# Patient Record
Sex: Female | Born: 1975
Health system: Southern US, Community
[De-identification: ages and names within clinical notes are randomized; demographics above are authoritative.]

## PROBLEM LIST (undated history)

## (undated) DIAGNOSIS — F32A Depression, unspecified: Secondary | ICD-10-CM

## (undated) DIAGNOSIS — I10 Essential (primary) hypertension: Secondary | ICD-10-CM

## (undated) DIAGNOSIS — F329 Major depressive disorder, single episode, unspecified: Secondary | ICD-10-CM

## (undated) DIAGNOSIS — F419 Anxiety disorder, unspecified: Secondary | ICD-10-CM

## (undated) DIAGNOSIS — M797 Fibromyalgia: Secondary | ICD-10-CM

## (undated) HISTORY — PX: ABDOMINAL HYSTERECTOMY: SHX81

## (undated) HISTORY — PX: APPENDECTOMY: SHX54

## (undated) HISTORY — PX: CHOLECYSTECTOMY: SHX55

## (undated) HISTORY — DX: Depression, unspecified: F32.A

---
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
## 2000-02-24 ENCOUNTER — Emergency Department (HOSPITAL_COMMUNITY): Admission: EM | Admit: 2000-02-24 | Discharge: 2000-02-24 | Payer: Self-pay | Admitting: Emergency Medicine

## 2003-10-23 ENCOUNTER — Ambulatory Visit (HOSPITAL_COMMUNITY): Admission: RE | Admit: 2003-10-23 | Discharge: 2003-10-23 | Payer: Self-pay | Admitting: Internal Medicine

## 2003-10-29 ENCOUNTER — Ambulatory Visit (HOSPITAL_COMMUNITY): Admission: RE | Admit: 2003-10-29 | Discharge: 2003-10-29 | Payer: Self-pay | Admitting: Internal Medicine

## 2004-10-10 ENCOUNTER — Ambulatory Visit: Payer: Self-pay | Admitting: Family Medicine

## 2004-10-17 ENCOUNTER — Ambulatory Visit: Payer: Self-pay | Admitting: Family Medicine

## 2008-08-18 ENCOUNTER — Emergency Department (HOSPITAL_COMMUNITY): Admission: EM | Admit: 2008-08-18 | Discharge: 2008-08-18 | Payer: Self-pay | Admitting: Emergency Medicine

## 2008-09-07 ENCOUNTER — Ambulatory Visit (HOSPITAL_COMMUNITY): Admission: RE | Admit: 2008-09-07 | Discharge: 2008-09-07 | Payer: Self-pay | Admitting: General Surgery

## 2009-07-08 ENCOUNTER — Emergency Department (HOSPITAL_COMMUNITY): Admission: EM | Admit: 2009-07-08 | Discharge: 2009-07-08 | Payer: Self-pay | Admitting: Emergency Medicine

## 2011-04-14 NOTE — H&P (Signed)
Gabrielle [name], Gabrielle [name]              ACCOUNT NO.:  [id number]   MEDICAL RECORD NO.:  [id number]          PATIENT TYPE:  AMB   LOCATION:  DAY                           FACILITY:  [facility]   PHYSICIAN:  Tilford Pillar, MD      DATE OF BIRTH:  [birth year]   DATE OF ADMISSION:  DATE OF DISCHARGE:  LH                              HISTORY & PHYSICAL   CHIEF COMPLAINT:  Perirectal pain.   HISTORY OF PRESENT ILLNESS:  The patient is a 35 year old female who has  had a several-month history of increasing perirectal pain.  Her most  recent episode was approximately a week and a half ago.  She denied any  bleeding.  No fever or chills, but she has had some nausea associated  with the pain.  She has attempted preparation H and sitz bath with  little result.  She was seen at emergency department and was told she  did have hemorrhoids and was recommended that she proceed for surgical  evaluation.  At that time, she was also given a prescription for Anusol,  which she states has helped somewhat.  She was also seen in another  outside hospital and was told that she did not have any hemorrhoidal  disease, which clearly upset the patient.  She was, at that point, seen  in my office at which time, she did proceed to show me a picture she had  taken of her prolapsed hemorrhoid.  She says the pain had increased  significantly.  She denies that this is continuously prolapsed and does  state that this does resolve because of geriatric bowel movements.  She  has had no significant change in bowel movements.  At that point, we did  discuss continued conservative measures.  With that the patient  attempted, but failed initial conservative management and at this point  did return to my office with continued pain and episodes of prolapsing  of the hemorrhoidal tissue.  Again, she has no bleeding.  She has  continued to use stool softeners.   PAST MEDICAL HISTORY:  Fibromyalgia.   PAST SURGICAL HISTORY:  Appendectomy  and cholecystectomy.  She has had  tubal ligation.  She has had tonsilloadenoidectomies.   MEDICATIONS:  She is on Cymbalta.  She is currently on Anusol, Percocet,  preparation H, and over-the-counter stool softener.   ALLERGIES:  No known drug allergies.   SOCIAL HISTORY:  She is a 1-pack per day smoker.  No alcohol use.  Occupation involves no heavy lifting.  She has had 3 prior pregnancies.   PERTINENT FAMILY HISTORY:  Diabetes mellitus and hypertension.   REVIEW OF SYSTEMS:  CONSTITUTIONAL:  Consist of headaches.  EYES:  Unremarkable.  EARS, NOSE, AND THROAT:  Unremarkable.  RESPIRATORY:  Unremarkable.  CARDIOVASCULAR:  Unremarkable.  GASTROINTESTINAL:  Unremarkable other than HPI.  GENITOURINARY:  Unremarkable.  MUSCULOSKELETAL:  Arthralgias of the joints and back.  SKIN:  Unremarkable.  ENDOCRINE:  She complains of lack of energy.  NEUROLOGIC:  She complains of peripheral paresthesias.   PHYSICAL EXAMINATION:  GENERAL:  The patient is somewhat anxious and  somewhat disheveled-appearing female.  She appeared to be somewhat  uncomfortable, but does not appear in any acute distress.  She is alert  and oriented x3.  HEENT:  Scalp:  No deformities, no masses.  Eyes:  Pupils equal, round,  and reactive to light.  Extraocular movements are intact.  No  conjunctival pallor is noted.  Ears:  No diminished hearing.  Oral  mucosa is pink.  Normal occlusion.  She has poor dentition.  NECK:  Trachea is midline.  No cervical lymphadenopathy is apparent.  PULMONARY:  Unlabored respirations.  No wheezes.  CHEST:  Clear to auscultation bilaterally.  CARDIOVASCULAR:  Regular rate and rhythm.  No murmurs, no gallop.  EXTREMITIES:  She has 2+ radial pulses.  ABDOMEN:  Positive bowel sounds.  Abdomen is soft.  Abdomen is  nontender.  No hernias.  No masses.  RECTAL EXAM:  She does have a positive prolapsing internal hemorrhoids.  She does have some perirectal tenderness around this area.  No  evidence  on examination of any fissures.  SKIN:  Warm and dry.   ASSESSMENT AND PLAN:  Prolapsing internal hemorrhoids.  At this point, I  did discuss with the patient surgical options.  While the pain is her  main complaint, I did discuss with the patient that this may not improve  significantly immediately following her operation.  However, she does  have evidence of a clearly prolapsing hemorrhoid both on exam as well as  the patient's previous documentation.  In addition, I did discuss with  the patient the pathophysiology of the anal fissure, although this is  very low on my suspicion in differential.  I did discuss with the  patient that at the time of operation, I would proceed with an exam  under anesthesia and should I discover a presence of a fissure, then at  that point I would seize from additional operative intervention and  discuss with the patient the medical treatment with nitroglycerin  ointment and creams.  If no fissure disease is present, I would discuss  with the patient the risks, benefits, and alternatives of  hemorrhoidectomy.  Her  questions were addressed and the patient will be  scheduled at her earliest convenience for the planned hemorrhoidectomy.      Tilford Pillar, MD  Electronically Signed     BZ/MEDQ  D:  08/30/2008  T:  08/31/2008  Job:  [id number]   cc:   Dr. Morrie Sheldon

## 2011-04-14 NOTE — Op Note (Signed)
Gabrielle [name], Gabrielle [name]              ACCOUNT NO.:  [id number]   MEDICAL RECORD NO.:  [id number]          PATIENT TYPE:  AMB   LOCATION:  DAY                           FACILITY:  [facility]   PHYSICIAN:  Tilford Pillar, MD      DATE OF BIRTH:  06-06-1976   DATE OF PROCEDURE:  09/07/2008  DATE OF DISCHARGE:                               OPERATIVE REPORT   PREOPERATIVE DIAGNOSIS:  Prolapsing internal hemorrhoid.   POSTOPERATIVE DIAGNOSES:  Prolapsing internal hemorrhoid and anal  fissure.   PROCEDURE:  Exam under anesthesia.   SURGEON:  Tilford Pillar, MD   ANESTHESIA:  General endotracheal, local anesthetic 1% lidocaine with  epinephrine.   SPECIMENS:  None.   ESTIMATED BLOOD LOSS:  None   INDICATIONS:  The patient is a 35 year old female who I had previously  seen in office on referral for a perirectal pain.  She has stated that  she has noted a prolapsing tissue after bowel movements and has a  significant pain following bowel movements.  She denied any tearing  sensations or any burning discomfort following bowel movements and has  only had intermittent episodes of bleeding.  Upon retraction of the  hemorrhoidal tissue, she states improvement of her symptomatology.  During her office visit, she did have significant perirectal pain and a  prolapsing internal hemorrhoid was identified, which was quite large.  Due to a severe pain, a complete exam was performed in the office.  The  risks, benefits, and alternatives of possible hemorrhoidectomy were  discussed at length with the patient including bleeding, infection,  recurrence as well as development of other hemorrhoidal column.  In  addition, it was discussed with the patient regarding her symptoms and  the possibility of an anal fissure, although her story was not classic  for an anal fissure.  I did discuss with the patient that if the fissure  will be identified at the time of her operation, that I would not  proceed with a  hemorrhoidectomy and rather we would attempt focused  medical treatment with nitroglycerin ointment in attempts to heal the  fissure before proceeding with hemorrhoidectomy.  She did understand  this and at this point, consent was obtained.   OPERATION:  The patient was taken to the operating room.  She was placed  in a supine position on the operating room table.  At this time, the  general anesthetic was administered.  Once the patient was placed, she  was endotracheally intubated by Anesthesia and then was positioned to  bilateral stirrups in the lithotomy position.  At this time, her  perineum and rectum were prepped with Betadine solution.  Sterile drapes  were placed.  At this point, I did perform a digital rectal exam.  I did  feel an area of thickened tissue anteriorly, and at this point, I opted  to proceed with the insertion of the speculum.  Visualization of the  anterior portion of the rectal mucosa demonstrated an approximately 1.5-  to 2-cm length fissure in this area.  There was a thickened tissue  around this area.  There was  no evidence of any mass.  We performed  circumferential evaluation of the distal rectum and anus.  There was  additionally noted a large prolapsing hemorrhoid.  At this point, local  anesthetic was instilled to help in recovery, and the operation ceased.  No packing was placed, and the patient was allowed to come out of the  general anesthetic.  She was placed back in the regular hospital bed and  taken to the Postanesthetic Care Unit in stable condition.  At the  conclusion of the procedure, all instrument, sponge, and needle counts  were correct.  The patient tolerated the procedure well.      Tilford Pillar, MD  Electronically Signed     BZ/MEDQ  D:  09/07/2008  T:  09/08/2008  Job:  [id number]   cc:   Primary Care Physician

## 2011-04-17 NOTE — Consult Note (Signed)
NAME:  Gabrielle [name], Gabrielle [name] NO.:  [id number]   MEDICAL RECORD NO.:  [id number]                  PATIENT TYPE:   LOCATION:                                       FACILITY:   PHYSICIAN:  R. Roetta Sessions, M.D.              DATE OF BIRTH:  [birth year]   DATE OF CONSULTATION:  10/12/2003  DATE OF DISCHARGE:                                   CONSULTATION   REQUESTING PHYSICIAN:  Dr. Ernestina Penna with [facility] in [place].   REASON FOR CONSULTATION:  Hematochezia.   HISTORY OF PRESENT ILLNESS:  Gabrielle [name] is a pleasant 35 year old Caucasian  female who presents today with a several-month history of alternating  constipation, diarrhea, mucous stools, bright red blood per rectum.  She  also complains of rectal pain and crampy abdominal pain, primarily in lower  mid abdomen.  She states she has significant hemorrhoids which cause her  pain as well.  She denies any or nausea.  She rarely has heartburn.  She has  had no significant weight changes.  She states her symptoms seem to be worse  with increased stress.  She is worried that she may have cancer.  Her  maternal aunt and maternal grandfather both had colon cancer at middle  age.  Her mother has had colonic polyps removed.  On occasion she passes  moderate-volume hematochezia.   CURRENT MEDICATIONS:  None.   ALLERGIES:  No known drug allergies.   PAST MEDICAL HISTORY:  Negative for chronic illnesses.   PAST SURGICAL HISTORY:  Appendectomy, tubal ligation.   FAMILY HISTORY:  Mother has history of colonic polyps.  Maternal grandfather  and maternal aunt had colon cancer.  She does not know any of her father's  history.   SOCIAL HISTORY:  She has been married for three years.  She has three  children ages 37, 68, and 3.  She is employed with __________.  She smokes  one pack of cigarettes daily, has smoked since age 44.  Denies any alcohol  use.   REVIEW OF SYSTEMS:  Please see HPI  for GI, for general.  GENITOURINARY:  Denies any dysuria.   PHYSICAL EXAMINATION:  VITAL SIGNS:  Weight 133, height 5 feet 7 inches.  Temperature 98.4, blood pressure 100/78, pulse 84.  GENERAL:  Pleasant, well-nourished, well-developed Caucasian female in no  acute distress.  She appears anxious.  SKIN:  Warm and dry, no jaundice.  HEENT:  Conjunctivae are pink, sclerae nonicteric.  Oropharyngeal mucosa  moist and pink.  No lesions, erythema or exudate.  No lymphadenopathy or  thyromegaly.  CHEST:  Lungs clear to auscultation.  CARDIAC:  Reveals regular rate and rhythm, normal S1, S2.  No murmurs, rubs,  or gallops.  ABDOMEN:  Positive bowel sounds, soft, nondistended.  She has mild diffuse  abdominal tenderness to deep palpation with some increased tenderness in the  left lower quadrant.  No rebound tenderness or guarding.  No  hepatosplenomegaly or masses.  RECTAL:  Deferred until the time of colonoscopy.  EXTREMITIES:  No edema.   IMPRESSION:  Gabrielle [name] is a pleasant 35 year old lady with a several-month  history of change in her bowel movements, going from constipation to  diarrhea, hematochezia.  She also has lower abdominal crampy-type abdominal  pain and rectal pain which she states is due to her hemorrhoids.  She is  quite anxious regarding these symptoms.  I do believe we need to go ahead  and proceed with colonoscopy at this time primarily to rule out inflammatory  bowel disease.  Ultimately, we may be dealing with IBS and rectal bleeding  from benign anorectal source.   PLAN:  1. Colonoscopy in the near future.  2. Trial of NuLev one sublingual t.i.d. p.r.n. abdominal pain #18 samples.  3. Fiber Choice two tablets daily.   I would like to thank Dr. Ralph Dowdy for allowing [place] to take part in the care of  this patient.     ________________________________________  ___________________________________________  Tana Coast, Pricilla Larsson,  M.D.   LL/MEDQ  D:  10/12/2003  T:  10/12/2003  Job:  [id number]   cc:   Ernestina Penna  [address]  [place]  [place] [address]  Fax: [phone]

## 2011-04-17 NOTE — Op Note (Signed)
NAME:  Gabrielle [name], Gabrielle [name]                        ACCOUNT NO.:  [id number]   MEDICAL RECORD NO.:  [id number]                   PATIENT TYPE:  AMB   LOCATION:  DAY                                  FACILITY:  [facility]   PHYSICIAN:  R. Roetta Sessions, M.D.              DATE OF BIRTH:  1975-12-02   DATE OF PROCEDURE:  10/23/2003  DATE OF DISCHARGE:                                 OPERATIVE REPORT   PROCEDURE:  Colonoscopy with ileoscopy.   ENDOSCOPIST:  Gerrit Friends. Rourk, M.D.   INDICATIONS FOR PROCEDURE:  The patient is [name] 35 year old lady with recent  bilateral lower quadrant abdominal pain, proctalgia, and rectal bleeding in  [name] setting of constipation.  Colonoscopy is now being done to further  evaluate her symptoms.  This approach has been discussed with the patient  previously and, again, at the bedside at length.  The potential risks,  benefits, and alternatives have been reviewed.  Please see my dictated  consultation note of October 12, 2003 for more information.   PROCEDURE NOTE:  O2 saturation, blood pressure, pulse and respirations were  monitored throughout the entire procedure.  Conscious sedation: Versed 3 mg IV, Demerol 75  mg IV in divided doses.   INSTRUMENT:  Olympus video chip pediatric colonoscope.   FINDINGS:  Digital rectal exam revealed some anal canal hemorrhoid tissue,  otherwise negative.   ENDOSCOPIC FINDINGS:  The prep was good.   RECTUM:  Examination of the rectal mucosa including the retroflex view of  the anal verge revealed only internal hemorrhoids.   COLON:  The colonic mucosa was surveyed from the rectosigmoid junction  through the left transverse and right colon to the area of the appendiceal  orifice, ileocecal valve, and cecum.   The patient had what appeared to be [name] retroverted/inverted appendix with;  again what appeared to be the appendix protruding into the cecal lumen.  Please see photos. The mucosa, otherwise, appeared entirely normal from  the  rectum to the cecum. The terminal ileum was intubated to 10 cm.  This  segment of the GI tract also appeared normal.   From the level of the cecum and ileocecal valve the scope was slowly  withdrawn.  All previously mentioned mucosal surfaces were again seen; no  other abnormalities were observed.  The patient tolerated the procedure well  and was reacted in endoscopy.   IMPRESSION:  1. Internal/anal canal hemorrhoids otherwise normal rectum.  2. Normal colon except for what appeared to be [name] retroverted/inverted     appendix.  3. Normal terminal ileum.   DISCUSSION:  Today's findings are interesting particularly in view of the  fact her history of reported appendectomy previously.  Today's findings may  or may not have clinical significance.  I suspect that she does have an  element of irritable bowel in [name] setting of intermittently bleeding  hemorrhoids.   RECOMMENDATIONS:  1. Continue NuLev 1 tablet  all the time before meals and at bedtime.  2. Continue fiber of choice tablets daily with adequate fluids.  3. Hemorrhoid literature provided to Ms. Whitten.  4. Analpram 2.5% cream applied to anorectum q.i.d. p.r.n. pain.  5. Proceed with abdominopelvic CT to look further at the area of abnormality     in the cecum.  6. Further recommendations to follow.      ___________________________________________                                            Jonathon Bellows, M.D.   RMR/MEDQ  D:  10/23/2003  T:  10/23/2003  Job:  [id number]   cc:   Ernestina Penna  [address]  [place]  [place] [address]  Fax: [phone]

## 2011-09-01 LAB — BASIC METABOLIC PANEL
BUN: 8
CO2: 25
Calcium: 10
Chloride: 105
Creatinine, Ser: 0.71
GFR calc Af Amer: >60
GFR calc non Af Amer: >60
Glucose, Bld: 86
Potassium: 4.3
Sodium: 140

## 2011-09-01 LAB — CBC
HCT: 42.4
Hemoglobin: 14.5
MCHC: 34.2
MCV: 85.9
Platelets: 225
RBC: 4.93
RDW: 13.8
WBC: 7.2

## 2011-09-01 LAB — HCG, SERUM, QUALITATIVE: Preg, Serum: NEGATIVE

## 2014-09-12 DIAGNOSIS — M545 Low back pain, unspecified: Secondary | ICD-10-CM | POA: Insufficient documentation

## 2015-08-07 ENCOUNTER — Emergency Department (HOSPITAL_COMMUNITY)
Admission: EM | Admit: 2015-08-07 | Discharge: 2015-08-07 | Disposition: A | Discharge status: Home / Self Care | Payer: Self-pay | Attending: Emergency Medicine | Admitting: Emergency Medicine

## 2015-08-07 ENCOUNTER — Emergency Department (HOSPITAL_COMMUNITY): Payer: Self-pay

## 2015-08-07 ENCOUNTER — Encounter (HOSPITAL_COMMUNITY): Payer: Self-pay

## 2015-08-07 DIAGNOSIS — Z720 Tobacco use: Secondary | ICD-10-CM | POA: Insufficient documentation

## 2015-08-07 DIAGNOSIS — F419 Anxiety disorder, unspecified: Secondary | ICD-10-CM

## 2015-08-07 DIAGNOSIS — R21 Rash and other nonspecific skin eruption: Secondary | ICD-10-CM | POA: Insufficient documentation

## 2015-08-07 DIAGNOSIS — Z8739 Personal history of other diseases of the musculoskeletal system and connective tissue: Secondary | ICD-10-CM | POA: Insufficient documentation

## 2015-08-07 DIAGNOSIS — I10 Essential (primary) hypertension: Secondary | ICD-10-CM | POA: Insufficient documentation

## 2015-08-07 DIAGNOSIS — R079 Chest pain, unspecified: Secondary | ICD-10-CM | POA: Insufficient documentation

## 2015-08-07 DIAGNOSIS — F410 Panic disorder [episodic paroxysmal anxiety] without agoraphobia: Secondary | ICD-10-CM | POA: Insufficient documentation

## 2015-08-07 DIAGNOSIS — Z79899 Other long term (current) drug therapy: Secondary | ICD-10-CM | POA: Insufficient documentation

## 2015-08-07 HISTORY — DX: Essential (primary) hypertension: I10

## 2015-08-07 HISTORY — DX: Anxiety disorder, unspecified: F41.9

## 2015-08-07 HISTORY — DX: Fibromyalgia: M79.7

## 2015-08-07 LAB — CBC WITH DIFFERENTIAL/PLATELET
Basophils Absolute: 0.1 10*3/uL (ref 0.0–0.1)
Basophils Relative: 0 % (ref 0–1)
Eosinophils Absolute: 0.4 10*3/uL (ref 0.0–0.7)
Eosinophils Relative: 3 % (ref 0–5)
HCT: 38.7 % (ref 36.0–46.0)
Hemoglobin: 13.2 g/dL (ref 12.0–15.0)
Lymphocytes Relative: 21 % (ref 12–46)
Lymphs Abs: 2.9 10*3/uL (ref 0.7–4.0)
MCH: 28.8 pg (ref 26.0–34.0)
MCHC: 34.1 g/dL (ref 30.0–36.0)
MCV: 84.5 fL (ref 78.0–100.0)
Monocytes Absolute: 0.9 10*3/uL (ref 0.1–1.0)
Monocytes Relative: 7 % (ref 3–12)
Neutro Abs: 9.3 10*3/uL — ABNORMAL HIGH (ref 1.7–7.7)
Neutrophils Relative %: 69 % (ref 43–77)
Platelets: 227 10*3/uL (ref 150–400)
RBC: 4.58 MIL/uL (ref 3.87–5.11)
RDW: 13.8 % (ref 11.5–15.5)
WBC: 13.5 10*3/uL — ABNORMAL HIGH (ref 4.0–10.5)

## 2015-08-07 LAB — BASIC METABOLIC PANEL
Anion gap: 7 (ref 5–15)
BUN: 16 mg/dL (ref 6–20)
CO2: 22 mmol/L (ref 22–32)
Calcium: 8.4 mg/dL — ABNORMAL LOW (ref 8.9–10.3)
Chloride: 108 mmol/L (ref 101–111)
Creatinine, Ser: 0.74 mg/dL (ref 0.44–1.00)
GFR calc Af Amer: >60 mL/min (ref >60)
GFR calc non Af Amer: >60 mL/min (ref >60)
Glucose, Bld: 98 mg/dL (ref 65–99)
Potassium: 3.9 mmol/L (ref 3.5–5.1)
Sodium: 137 mmol/L (ref 135–145)

## 2015-08-07 LAB — TROPONIN I
Troponin I: <0.03 ng/mL (ref <0.031)
Troponin I: <0.03 ng/mL (ref <0.031)

## 2015-08-07 MED ORDER — LORAZEPAM 1 MG PO TABS
1.0000 mg | ORAL_TABLET | Freq: Once | ORAL | Status: AC
  Administered 2015-08-07: 1 mg via ORAL
  Filled 2015-08-07: qty 1

## 2015-08-07 MED ORDER — HYDROXYZINE HCL 50 MG/ML IM SOLN
50.0000 mg | Freq: Once | INTRAMUSCULAR | Status: AC
  Administered 2015-08-07: 50 mg via INTRAMUSCULAR
  Filled 2015-08-07: qty 1

## 2015-08-07 NOTE — ED Notes (Signed)
Pt was seen ambulating off unit and out of ER- room was cleaned for another pt - when pt ambulated back on unit and into room - Primary nurse aware

## 2015-08-07 NOTE — ED Notes (Signed)
Pt states that she has been having bad panic attacks recently due to [facility] charges brought upon her in May related to smoking marijuana with her 41 year old.  Pt states that she does not regularly use drugs and is having to go to jail every week from Wed night to Fri night and she does not have any support at home.  Denies suicidal/homicidal thoughts.

## 2015-08-07 NOTE — ED Notes (Signed)
Pt denies any needs at this time.  Family sitting with pt.   

## 2015-08-07 NOTE — ED Provider Notes (Signed)
CSN: [id number]     Arrival date & time 08/07/15  1024 History  This chart was scribed for No att. providers found by Marica Otter, ED Scribe. This patient was seen in room APA19/APA19 and the patient's care was started at 10:33 AM.   Chief Complaint  Patient presents with  . Anxiety   The history is provided by the patient. No language interpreter was used.   PCP: No primary care provider on file. HPI Comments: RHEANA CASEBOLT is a 39 y.o. female, with PMHx noted below including anxiety, who presents to the Emergency Department complaining of increased anxiety with associated chest pain and SOB onset this morning immediately after pt got into an argument with her daughter at work. Pt reports the chest pain and SOB have both resolved. Pt reports having 2-3 panic attacks/wk for the past couple of months.  Pt also complains of rash to her chest and back onset 4 days ago. Pt denies any new exposures.  Past Medical History  Diagnosis Date  . Fibromyalgia   . Anxiety   . Hypertension    Past Surgical History  Procedure Laterality Date  . Cholecystectomy    . Abdominal hysterectomy    . Appendectomy     History reviewed. No pertinent family history. Social History  Substance Use Topics  . Smoking status: Current Every Day Smoker  . Smokeless tobacco: None  . Alcohol Use: No   OB History    No data available     Review of Systems  Constitutional: Negative for fever and chills.  Respiratory: Positive for shortness of breath.   Cardiovascular: Positive for chest pain.  Skin: Positive for color change and rash.  Psychiatric/Behavioral: The patient is nervous/anxious.   All other systems reviewed and are negative.     Allergies  Percocet  Home Medications   Prior to Admission medications   Medication Sig Start Date End Date Taking? Authorizing Provider  acetaminophen (TYLENOL) 500 MG tablet Take 1,000-1,500 mg by mouth every 6 (six) hours as needed for headache.   Yes  Historical Provider, MD  ALPRAZolam Prudy Feeler) 0.5 MG tablet Take 0.5 mg by mouth 4 (four) times daily as needed for anxiety.    Yes Historical Provider, MD  HYDROcodone-acetaminophen (NORCO) 10-325 MG per tablet Take 1 tablet by mouth 3 (three) times daily as needed for moderate pain.    Yes Historical Provider, MD  lisinopril (PRINIVIL,ZESTRIL) 20 MG tablet Take 20 mg by mouth daily.   Yes Historical Provider, MD   Triage Vitals: BP 124/86 mmHg  Pulse 78  Temp(Src) 97.8 F (36.6 C) (Oral)  Resp 18  Ht 5\' 7"  (1.702 m)  Wt 153 lb (69.4 kg)  BMI 23.96 kg/m2  SpO2 99% Physical Exam  Constitutional: She is oriented to person, place, and time. She appears well-developed and well-nourished. No distress.  HENT:  Head: Normocephalic and atraumatic.  Right Ear: Hearing normal.  Left Ear: Hearing normal.  Nose: Nose normal.  Mouth/Throat: Oropharynx is clear and moist and mucous membranes are normal.  Eyes: Conjunctivae and EOM are normal. Pupils are equal, round, and reactive to light.  Neck: Normal range of motion. Neck supple.  Cardiovascular: Regular rhythm, S1 normal and S2 normal.  Exam reveals no gallop and no friction rub.   No murmur heard. Pulmonary/Chest: Effort normal and breath sounds normal. No respiratory distress. She exhibits no tenderness.  Abdominal: Soft. Normal appearance and bowel sounds are normal. There is no hepatosplenomegaly. There is no tenderness. There  is no rebound, no guarding, no tenderness at McBurney's point and negative Murphy's sign. No hernia.  Musculoskeletal: Normal range of motion.  Neurological: She is alert and oriented to person, place, and time. She has normal strength. No cranial nerve deficit or sensory deficit. Coordination normal. GCS eye subscore is 4. GCS verbal subscore is 5. GCS motor subscore is 6.  Skin: Skin is warm, dry and intact. Rash noted. No cyanosis.  Excoriations in upper chest   Psychiatric: Her speech is normal.  Anxious and  tearful during exam   Nursing note and vitals reviewed.   ED Course  Procedures (including critical care time) DIAGNOSTIC STUDIES: Oxygen Saturation is 99% on RA, nl by my interpretation.    COORDINATION OF CARE: 10:35 AM-Discussed treatment plan which includes EKG, labs with pt at bedside and pt agreed to plan.  12:41 PM: Recheck, pt resting comfortably. Discussed cardiac results. Pt continues to complain of itchy rash, discussed HydroxOlene.  Labs Review Labs Reviewed  CBC WITH DIFFERENTIAL/PLATELET - Abnormal; Notable for the following:    WBC 13.5 (*)    Neutro Abs 9.3 (*)    All other components within normal limits  BASIC METABOLIC PANEL - Abnormal; Notable for the following:    Calcium 8.4 (*)    All other components within normal limits  TROPONIN I  TROPONIN I    Imaging Review Dg Chest 2 View  08/07/2015   CLINICAL DATA:  Chest pain, shortness of Breath, history of panic attacks  EXAM: CHEST  2 VIEW  COMPARISON:  04/20/2015  FINDINGS: Cardiomediastinal silhouette is stable. Minimal mid thoracic dextroscoliosis. No acute infiltrate or pleural effusion. No pulmonary edema. Bony thorax is unremarkable.  IMPRESSION: No active cardiopulmonary disease.   Electronically Signed   By: Natasha Mead M.D.   On: 08/07/2015 11:44   I have personally reviewed and evaluated these images and lab results as part of my medical decision-making.   EKG Interpretation   Date/Time:  Wednesday August 07 2015 10:29:27 EDT Ventricular Rate:  84 PR Interval:  130 QRS Duration: 87 QT Interval:  383 QTC Calculation: 453 R Axis:   96 Text Interpretation:  Sinus rhythm Borderline right axis deviation Minimal  ST elevation, inferior leads No previous tracing Confirmed by Reford Olliff  MD,  Jaeveon Ashland [phone]) on 08/07/2015 10:31:42 AM      MDM   Final diagnoses:  Chest pain, unspecified chest pain type  Anxiety    Presents to the emergency department for evaluation of chest pain. Patient  reports that she had a fight with her daughter earlier today and caused her to have a panic attack. She had shortness of breath and difficulty breathing. She has had frequent panic attacks over the last few months. Her cardiac workup is negative. She does not have significant cardiac risk factors. She is feeling better after treatment for anxiety. She will need to follow-up with psychiatry for treatment of worsening anxiety and panic attacks.  I personally performed the services described in this documentation, which was scribed in my presence. The recorded information has been reviewed and is accurate.    Gilda Crease, MD 08/07/15 [phone]

## 2015-08-07 NOTE — ED Notes (Signed)
Pt reports she works at [employer] with her daughter and they got into an argument at work.  Reports pt became very upset, started having cp and was hyperventilating.   bp 170/98, HR 102.  Reports deep breaths make cp worse.

## 2015-08-07 NOTE — Discharge Instructions (Signed)
Chest Pain (Nonspecific) °It is often hard to give a specific diagnosis for the cause of chest pain. There is always a chance that your pain could be related to something serious, such as a heart attack or a blood clot in the lungs. You need to follow up with your health care provider for further evaluation. °CAUSES  °· Heartburn. °· Pneumonia or bronchitis. °· Anxiety or stress. °· Inflammation around your heart (pericarditis) or lung (pleuritis or pleurisy). °· A blood clot in the lung. °· A collapsed lung (pneumothorax). It can develop suddenly on its own (spontaneous pneumothorax) or from trauma to the chest. °· Shingles infection (herpes zoster virus). °The chest wall is composed of bones, muscles, and cartilage. Any of these can be the source of the pain. °· The bones can be bruised by injury. °· The muscles or cartilage can be strained by coughing or overwork. °· The cartilage can be affected by inflammation and become sore (costochondritis). °DIAGNOSIS  °Lab tests or other studies may be needed to find the cause of your pain. Your health care provider may have you take a test called an ambulatory electrocardiogram (ECG). An ECG records your heartbeat patterns over a 24-hour period. You may also have other tests, such as: °· Transthoracic echocardiogram (TTE). During echocardiography, sound waves are used to evaluate how blood flows through your heart. °· Transesophageal echocardiogram (TEE). °· Cardiac monitoring. This allows your health care provider to monitor your heart rate and rhythm in real time. °· Holter monitor. This is a portable device that records your heartbeat and can help diagnose heart arrhythmias. It allows your health care provider to track your heart activity for several days, if needed. °· Stress tests by exercise or by giving medicine that makes the heart beat faster. °TREATMENT  °· Treatment depends on what may be causing your chest pain. Treatment may include: °· Acid blockers for  heartburn. °· Anti-inflammatory medicine. °· Pain medicine for inflammatory conditions. °· Antibiotics if an infection is present. °· You may be advised to change lifestyle habits. This includes stopping smoking and avoiding alcohol, caffeine, and chocolate. °· You may be advised to keep your head raised (elevated) when sleeping. This reduces the chance of acid going backward from your stomach into your esophagus. °Most of the time, nonspecific chest pain will improve within 2-3 days with rest and mild pain medicine.  °HOME CARE INSTRUCTIONS  °· If antibiotics were prescribed, take them as directed. Finish them even if you start to feel better. °· For the next few days, avoid physical activities that bring on chest pain. Continue physical activities as directed. °· Do not use any tobacco products, including cigarettes, chewing tobacco, or electronic cigarettes. °· Avoid drinking alcohol. °· Only take medicine as directed by your health care provider. °· Follow your health care provider's suggestions for further testing if your chest pain does not go away. °· Keep any follow-up appointments you made. If you do not go to an appointment, you could develop lasting (chronic) problems with pain. If there is any problem keeping an appointment, call to reschedule. °SEEK MEDICAL CARE IF:  °· Your chest pain does not go away, even after treatment. °· You have a rash with blisters on your chest. °· You have a fever. °SEEK IMMEDIATE MEDICAL CARE IF:  °· You have increased chest pain or pain that spreads to your arm, neck, jaw, back, or abdomen. °· You have shortness of breath. °· You have an increasing cough, or you cough   up blood.  You have severe back or abdominal pain.  You feel nauseous or vomit.  You have severe weakness.  You faint.  You have chills. This is an emergency. Do not wait to see if the pain will go away. Get medical help at once. Call your local emergency services (911 in [place] Do not drive  yourself to the hospital. MAKE SURE YOU:   Understand these instructions.  Will watch your condition.  Will get help right away if you are not doing well or get worse. Document Released: 08/26/2005 Document Revised: 11/21/2013 Document Reviewed: 06/21/2008 [facility] Patient Information 2015 [place], [place]. This information is not intended to replace advice given to you by your health care provider. Make sure you discuss any questions you have with your health care provider. Panic Attacks Panic attacks are sudden, short-livedsurges of severe anxiety, fear, or discomfort. They may occur for no reason when you are relaxed, when you are anxious, or when you are sleeping. Panic attacks may occur for a number of reasons:   Healthy people occasionally have panic attacks in extreme, life-threatening situations, such as war or natural disasters. Normal anxiety is a protective mechanism of the body that helps [place] react to danger (fight or flight response).  Panic attacks are often seen with anxiety disorders, such as panic disorder, social anxiety disorder, generalized anxiety disorder, and phobias. Anxiety disorders cause excessive or uncontrollable anxiety. They may interfere with your relationships or other life activities.  Panic attacks are sometimes seen with other mental illnesses, such as depression and posttraumatic stress disorder.  Certain medical conditions, prescription medicines, and drugs of abuse can cause panic attacks. SYMPTOMS  Panic attacks start suddenly, peak within 20 minutes, and are accompanied by four or more of the following symptoms:  Pounding heart or fast heart rate (palpitations).  Sweating.  Trembling or shaking.  Shortness of breath or feeling smothered.  Feeling choked.  Chest pain or discomfort.  Nausea or strange feeling in your stomach.  Dizziness, light-headedness, or feeling like you will faint.  Chills or hot flushes.  Numbness or tingling in  your lips or hands and feet.  Feeling that things are not real or feeling that you are not yourself.  Fear of losing control or going crazy.  Fear of dying. Some of these symptoms can mimic serious medical conditions. For example, you may think you are having a heart attack. Although panic attacks can be very scary, they are not life threatening. DIAGNOSIS  Panic attacks are diagnosed through an assessment by your health care provider. Your health care provider will ask questions about your symptoms, such as where and when they occurred. Your health care provider will also ask about your medical history and use of alcohol and drugs, including prescription medicines. Your health care provider may order blood tests or other studies to rule out a serious medical condition. Your health care provider may refer you to a mental health professional for further evaluation. TREATMENT   Most healthy people who have one or two panic attacks in an extreme, life-threatening situation will not require treatment.  The treatment for panic attacks associated with anxiety disorders or other mental illness typically involves counseling with a mental health professional, medicine, or a combination of both. Your health care provider will help determine what treatment is best for you.  Panic attacks due to physical illness usually go away with treatment of the illness. If prescription medicine is causing panic attacks, talk with your health care provider  about stopping the medicine, decreasing the dose, or substituting another medicine.  Panic attacks due to alcohol or drug abuse go away with abstinence. Some adults need professional help in order to stop drinking or using drugs. HOME CARE INSTRUCTIONS   Take all medicines as directed by your health care provider.   Schedule and attend follow-up visits as directed by your health care provider. It is important to keep all your appointments. SEEK MEDICAL CARE  IF:  You are not able to take your medicines as prescribed.  Your symptoms do not improve or get worse. SEEK IMMEDIATE MEDICAL CARE IF:   You experience panic attack symptoms that are different than your usual symptoms.  You have serious thoughts about hurting yourself or others.  You are taking medicine for panic attacks and have a serious side effect. MAKE SURE YOU:  Understand these instructions.  Will watch your condition.  Will get help right away if you are not doing well or get worse. Document Released: 11/16/2005 Document Revised: 11/21/2013 Document Reviewed: 06/30/2013 [facility] Patient Information 2015 [place], [place]. This information is not intended to replace advice given to you by your health care provider. Make sure you discuss any questions you have with your health care provider.  Outpatient Psychiatry and Counseling  [facility]: Mobile Crisis Management 24 hours:  [phone]  [facility] [employer] sliding scale fee and walk in schedule: M-F 8am-12pm/1pm-3pm [address]  [place], [place] [address] [phone]  [facility] [address] [place], [place] [address] [phone]  [facility] [employer] new patient walk-in appointments available Monday - Friday 8am -3pm.          [address] [place], [place] [address] [phone] or crisis line- [phone]  [facility]/ Intensive Outpatient Therapy Program [address] [place], [place] [address] [phone]  [facility]                  Crisis Services      [phone] [address]     [place], [place] [address]                 [facility]   [facility] [phone]. [address] [place], [place] [address]   [facility] of Care          [address]  [place], [place] [address]       [phone]  [facility] [address] [place], [place] [address] [phone]  [facility]    [address]    [place], [place] [address]     [phone]       Andee Poles, MD     [address]     [place] [address]     [phone]       [facility] [address] [place] [address]  [facility]     [address] [place], [place]      [phone]       [facility] Eulogio Ditch, MD [address] [place], [place] [address] [phone]  [facility]     [address]     [place], [place] [address]     [phone]       [facility] [address] [place], [place] [address] [phone] Resources for Temporary Residential Assistance/Crisis Centers

## 2015-08-28 ENCOUNTER — Emergency Department (HOSPITAL_COMMUNITY): Payer: Self-pay

## 2015-08-28 ENCOUNTER — Emergency Department (HOSPITAL_COMMUNITY)
Admission: EM | Admit: 2015-08-28 | Discharge: 2015-08-28 | Disposition: A | Discharge status: Home / Self Care | Payer: Self-pay | Attending: Emergency Medicine | Admitting: Emergency Medicine

## 2015-08-28 ENCOUNTER — Encounter (HOSPITAL_COMMUNITY): Payer: Self-pay | Admitting: *Deleted

## 2015-08-28 DIAGNOSIS — I10 Essential (primary) hypertension: Secondary | ICD-10-CM | POA: Insufficient documentation

## 2015-08-28 DIAGNOSIS — Z79899 Other long term (current) drug therapy: Secondary | ICD-10-CM | POA: Insufficient documentation

## 2015-08-28 DIAGNOSIS — R079 Chest pain, unspecified: Secondary | ICD-10-CM | POA: Insufficient documentation

## 2015-08-28 DIAGNOSIS — Z720 Tobacco use: Secondary | ICD-10-CM | POA: Insufficient documentation

## 2015-08-28 DIAGNOSIS — F419 Anxiety disorder, unspecified: Secondary | ICD-10-CM | POA: Insufficient documentation

## 2015-08-28 LAB — I-STAT TROPONIN, ED
Troponin i, poc: 0 ng/mL (ref 0.00–0.08)
Troponin i, poc: 0 ng/mL (ref 0.00–0.08)

## 2015-08-28 LAB — BASIC METABOLIC PANEL
Anion gap: 8 (ref 5–15)
BUN: 14 mg/dL (ref 6–20)
CO2: 20 mmol/L — ABNORMAL LOW (ref 22–32)
Calcium: 8.4 mg/dL — ABNORMAL LOW (ref 8.9–10.3)
Chloride: 108 mmol/L (ref 101–111)
Creatinine, Ser: 0.62 mg/dL (ref 0.44–1.00)
GFR calc Af Amer: >60 mL/min (ref >60)
GFR calc non Af Amer: >60 mL/min (ref >60)
Glucose, Bld: 82 mg/dL (ref 65–99)
Potassium: 4.2 mmol/L (ref 3.5–5.1)
Sodium: 136 mmol/L (ref 135–145)

## 2015-08-28 LAB — CBC
HCT: 40.4 % (ref 36.0–46.0)
Hemoglobin: 13.9 g/dL (ref 12.0–15.0)
MCH: 29.3 pg (ref 26.0–34.0)
MCHC: 34.4 g/dL (ref 30.0–36.0)
MCV: 85.1 fL (ref 78.0–100.0)
Platelets: 238 10*3/uL (ref 150–400)
RBC: 4.75 MIL/uL (ref 3.87–5.11)
RDW: 13.7 % (ref 11.5–15.5)
WBC: 10.5 10*3/uL (ref 4.0–10.5)

## 2015-08-28 MED ORDER — ASPIRIN 81 MG PO CHEW
CHEWABLE_TABLET | ORAL | Status: AC
  Filled 2015-08-28: qty 4

## 2015-08-28 MED ORDER — ASPIRIN 81 MG PO CHEW
324.0000 mg | CHEWABLE_TABLET | Freq: Once | ORAL | Status: AC
  Administered 2015-08-28: 324 mg via ORAL

## 2015-08-28 MED ORDER — SODIUM CHLORIDE 0.9 % IV BOLUS (SEPSIS)
1000.0000 mL | Freq: Once | INTRAVENOUS | Status: AC
  Administered 2015-08-28: 1000 mL via INTRAVENOUS

## 2015-08-28 NOTE — ED Provider Notes (Signed)
CSN: [id number]     Arrival date & time 08/28/15  4098 History   First MD Initiated Contact with Patient 08/28/15 [phone]     Chief Complaint  Patient presents with  . Chest Pain     (Consider location/radiation/quality/duration/timing/severity/associated sxs/prior Treatment) HPI Comments: 39 year old female with smoking history no alcohol use presents with recurrent chest cramping sometimes right-sided chest other times left-sided chest or central. No focal radiation. Patient has had cramping in both her upper arms and legs for the past 2 weeks. Patient has had almost daily chest cramping for 2 weeks. No specific associations no exertional symptoms. Patient does have episodes of lightheadedness and is also passed out after feeling lightheaded while standing. No cardiac history known, no classic blood clot risk factors. Patient does have high blood pressure history. Currently no chest discomfort last episode 5:00 this morning.  Patient is a 39 y.o. female presenting with chest pain. The history is provided by the patient.  Chest Pain Associated symptoms: no abdominal pain, no back pain, no fever, no headache, no shortness of breath and not vomiting     Past Medical History  Diagnosis Date  . Fibromyalgia   . Anxiety   . Hypertension    Past Surgical History  Procedure Laterality Date  . Cholecystectomy    . Abdominal hysterectomy    . Appendectomy     History reviewed. No pertinent family history. Social History  Substance Use Topics  . Smoking status: Current Every Day Smoker  . Smokeless tobacco: None  . Alcohol Use: No   OB History    No data available     Review of Systems  Constitutional: Negative for fever and chills.  HENT: Negative for congestion.   Eyes: Negative for visual disturbance.  Respiratory: Negative for shortness of breath.   Cardiovascular: Positive for chest pain.  Gastrointestinal: Negative for vomiting and abdominal pain.  Genitourinary: Negative for  dysuria and flank pain.  Musculoskeletal: Negative for back pain, neck pain and neck stiffness.  Skin: Negative for rash.  Neurological: Positive for light-headedness. Negative for headaches.      Allergies  Percocet  Home Medications   Prior to Admission medications   Medication Sig Start Date End Date Taking? Authorizing Provider  ALPRAZolam Prudy Feeler) 0.5 MG tablet Take 0.5 mg by mouth 4 (four) times daily as needed for anxiety.    Yes Historical Provider, MD  HYDROcodone-acetaminophen (NORCO) 10-325 MG per tablet Take 1 tablet by mouth 3 (three) times daily as needed for moderate pain.    Yes Historical Provider, MD  lisinopril (PRINIVIL,ZESTRIL) 20 MG tablet Take 20 mg by mouth daily.   Yes Historical Provider, MD  acetaminophen (TYLENOL) 500 MG tablet Take 1,000-1,500 mg by mouth every 6 (six) hours as needed for headache.    Historical Provider, MD   BP 107/77 mmHg  Pulse 60  Temp(Src) 97.6 F (36.4 C) (Oral)  Resp 12  Ht  (1.702 m)  Wt 153 lb (69.4 kg)  BMI 23.96 kg/m2  SpO2 100% Physical Exam  Constitutional: She is oriented to person, place, and time. She appears well-developed and well-nourished.  HENT:  Head: Normocephalic and atraumatic.  Eyes: Conjunctivae are normal. Right eye exhibits no discharge. Left eye exhibits no discharge.  Neck: Normal range of motion. Neck supple. No tracheal deviation present.  Cardiovascular: Normal rate, regular rhythm and intact distal pulses.   No murmur heard. Pulmonary/Chest: Effort normal and breath sounds normal.  Abdominal: Soft. She exhibits no distension. There  is no tenderness. There is no guarding.  Musculoskeletal: She exhibits no edema.  Neurological: She is alert and oriented to person, place, and time. No cranial nerve deficit.  Skin: Skin is warm. No rash noted.  Psychiatric: She has a normal mood and affect.  Nursing note and vitals reviewed.   ED Course  Procedures (including critical care time) Labs  Review Labs Reviewed  BASIC METABOLIC PANEL - Abnormal; Notable for the following:    CO2 20 (*)    Calcium 8.4 (*)    All other components within normal limits  CBC  I-STAT TROPOININ, ED  I-STAT TROPOININ, ED  Rosezena Sensor, ED    Imaging Review Dg Chest 2 View  08/28/2015   CLINICAL DATA:  Central chest pain beginning this morning at 5:15 a.m.  EXAM: CHEST  2 VIEW  COMPARISON:  PA and lateral chest 08/07/2015 and 04/20/2015.  FINDINGS: The lungs are clear. Heart size is normal. No pneumothorax or pleural effusion.  IMPRESSION: Negative chest.   Electronically Signed   By: Drusilla Kanner M.D.   On: 08/28/2015 07:49   I have personally reviewed and evaluated these images and lab results as part of my medical decision-making.   EKG Interpretation   Date/Time:  Wednesday August 28 2015 06:56:34 EDT Ventricular Rate:  65 PR Interval:  123 QRS Duration: 91 QT Interval:  403 QTC Calculation: 419 R Axis:   106 Text Interpretation:  Sinus rhythm Right axis deviation Borderline ST  elevation, inferior leads Baseline wander in lead(s) III V2 When compared  with ECG of 08/07/2015, No significant change was found Confirmed by [facility]   MD, DAVID ([id number]) on 08/28/2015 6:58:42 AM      MDM   Final diagnoses:  Chest pain, unspecified chest pain type    Well-appearing female currently no symptoms presents after recurrent atypical chest cramping for the past 2 weeks. With her current symptoms and 2 cardiac risk factors plan for cardiac screen and discussed she will need close outpatient follow-up for likely ultrasound/stress test by primary doctor.Low risk cardiac.  Plan for delta troponin. No concern for PE at this time, no risk factors and presentation atypical. PERC neg  Patient well-appearing on recheck no pain. If second troponin negative patient stable for close outpatient follow-up Results and differential diagnosis were discussed with the patient/parent/guardian. Xrays were  independently reviewed by myself.  Close follow up outpatient was discussed, comfortable with the plan.   Medications  sodium chloride 0.9 % bolus 1,000 mL (0 mLs Intravenous Stopped 08/28/15 1014)  aspirin chewable tablet 324 mg (324 mg Oral Given 08/28/15 0733)    Filed Vitals:   08/28/15 0800 08/28/15 0830 08/28/15 0838 08/28/15 0900  BP: 118/86 112/80  107/77  Pulse: 54 57  60  Temp:   97.6 F (36.4 C)   TempSrc:   Oral   Resp: Height:      Weight:      SpO2: 100% 100%  100%    Final diagnoses:  Chest pain, unspecified chest pain type      Blane Ohara, MD 08/28/15 1055

## 2015-08-28 NOTE — ED Notes (Signed)
Pt c/o central chest pain that started this am at 0515; pt states the pain is worse with breathing

## 2015-08-28 NOTE — Discharge Instructions (Signed)
If you were given medicines take as directed.  If you are on coumadin or contraceptives realize their levels and effectiveness is altered by many different medicines.  If you have any reaction (rash, tongues swelling, other) to the medicines stop taking and see a physician.    If your blood pressure was elevated in the ER make sure you follow up for management with a primary doctor or return for chest pain, shortness of breath or stroke symptoms.  Please follow up as directed and return to the ER or see a physician for new or worsening symptoms.  Thank you. Filed Vitals:   08/28/15 0700 08/28/15 0712 08/28/15 0730  BP: 111/86  112/78  Pulse: 66  60  Temp:  97.8 F (36.6 C)   TempSrc:  Oral   Resp: 17  18  Height:   (1.702 m)   Weight:  153 lb (69.4 kg)   SpO2: 100%  98%

## 2015-08-28 NOTE — ED Notes (Signed)
I-stat troponin 0.00. EDP aware. Reported to re-evaluate pt's troponin at 3 hour interval. If negative pt would be discharged home. Pt and Pt family aware.

## 2016-07-28 ENCOUNTER — Telehealth: Payer: Self-pay | Admitting: Physician Assistant

## 2016-07-28 NOTE — Telephone Encounter (Signed)
Spoke with patient, she has been having some nausea and dizziness.  She found a tick on her leg yesterday.  I told the patient she should be seen for evaluation.  She is unable to come in today, so appointment was made with [facility] tomorrow at 11:10 am.

## 2016-07-29 ENCOUNTER — Ambulatory Visit (INDEPENDENT_AMBULATORY_CARE_PROVIDER_SITE_OTHER): Payer: Self-pay | Admitting: Physician Assistant

## 2016-07-29 ENCOUNTER — Encounter: Payer: Self-pay | Admitting: Physician Assistant

## 2016-07-29 VITALS — BP 133/89 | HR 80 | Temp 97.5°F | Ht 67.0 in | Wt 152.4 lb

## 2016-07-29 DIAGNOSIS — R5383 Other fatigue: Secondary | ICD-10-CM

## 2016-07-29 DIAGNOSIS — I10 Essential (primary) hypertension: Secondary | ICD-10-CM

## 2016-07-29 DIAGNOSIS — F17200 Nicotine dependence, unspecified, uncomplicated: Secondary | ICD-10-CM | POA: Insufficient documentation

## 2016-07-29 DIAGNOSIS — W57XXXA Bitten or stung by nonvenomous insect and other nonvenomous arthropods, initial encounter: Secondary | ICD-10-CM

## 2016-07-29 DIAGNOSIS — M797 Fibromyalgia: Secondary | ICD-10-CM

## 2016-07-29 DIAGNOSIS — F329 Major depressive disorder, single episode, unspecified: Secondary | ICD-10-CM

## 2016-07-29 DIAGNOSIS — R55 Syncope and collapse: Secondary | ICD-10-CM

## 2016-07-29 DIAGNOSIS — F411 Generalized anxiety disorder: Secondary | ICD-10-CM | POA: Insufficient documentation

## 2016-07-29 DIAGNOSIS — F32A Depression, unspecified: Secondary | ICD-10-CM

## 2016-07-29 DIAGNOSIS — T148 Other injury of unspecified body region: Secondary | ICD-10-CM

## 2016-07-29 MED ORDER — AMOXICILLIN 500 MG PO CAPS
500.0000 mg | ORAL_CAPSULE | Freq: Three times a day (TID) | ORAL | 0 refills | Status: DC
Start: 2016-07-29 — End: 2016-12-14

## 2016-07-29 MED ORDER — CITALOPRAM HYDROBROMIDE 20 MG PO TABS: 20.0000 mg | ORAL_TABLET | Freq: Every day | ORAL | 5 refills | Status: DC

## 2016-07-29 NOTE — Patient Instructions (Signed)
Smoking Cessation, Tips for Success If you are ready to quit smoking, congratulations! You have chosen to help yourself be healthier. Cigarettes bring nicotine, tar, carbon monoxide, and other irritants into your body. Your lungs, heart, and blood vessels will be able to work better without these poisons. There are many different ways to quit smoking. Nicotine gum, nicotine patches, a nicotine inhaler, or nicotine nasal spray can help with physical craving. Hypnosis, support groups, and medicines help break the habit of smoking. WHAT THINGS CAN I DO TO MAKE QUITTING EASIER?  Here are some tips to help you quit for good:  Pick a date when you will quit smoking completely. Tell all of your friends and family about your plan to quit on that date.  Do not try to slowly cut down on the number of cigarettes you are smoking. Pick a quit date and quit smoking completely starting on that day.  Throw away all cigarettes.   Clean and remove all ashtrays from your home, work, and car.  On a card, write down your reasons for quitting. Carry the card with you and read it when you get the urge to smoke.  Cleanse your body of nicotine. Drink enough water and fluids to keep your urine clear or pale yellow. Do this after quitting to flush the nicotine from your body.  Learn to predict your moods. Do not let a bad situation be your excuse to have a cigarette. Some situations in your life might tempt you into wanting a cigarette.  Never have "just one" cigarette. It leads to wanting another and another. Remind yourself of your decision to quit.  Change habits associated with smoking. If you smoked while driving or when feeling stressed, try other activities to replace smoking. Stand up when drinking your coffee. Brush your teeth after eating. Sit in a different chair when you read the paper. Avoid alcohol while trying to quit, and try to drink fewer caffeinated beverages. Alcohol and caffeine may urge you to  smoke.  Avoid foods and drinks that can trigger a desire to smoke, such as sugary or spicy foods and alcohol.  Ask people who smoke not to smoke around you.  Have something planned to do right after eating or having a cup of coffee. For example, plan to take a walk or exercise.  Try a relaxation exercise to calm you down and decrease your stress. Remember, you may be tense and nervous for the first 2 weeks after you quit, but this will pass.  Find new activities to keep your hands busy. Play with a pen, coin, or rubber band. Doodle or draw things on paper.  Brush your teeth right after eating. This will help cut down on the craving for the taste of tobacco after meals. You can also try mouthwash.   Use oral substitutes in place of cigarettes. Try using lemon drops, carrots, cinnamon sticks, or chewing gum. Keep them handy so they are available when you have the urge to smoke.  When you have the urge to smoke, try deep breathing.  Designate your home as a nonsmoking area.  If you are a heavy smoker, ask your health care provider about a prescription for nicotine chewing gum. It can ease your withdrawal from nicotine.  Reward yourself. Set aside the cigarette money you save and buy yourself something nice.  Look for support from others. Join a support group or smoking cessation program. Ask someone at home or at work to help you with your plan   to quit smoking.  Always ask yourself, "Do I need this cigarette or is this just a reflex?" Tell yourself, "Today, I choose not to smoke," or "I do not want to smoke." You are reminding yourself of your decision to quit.  Do not replace cigarette smoking with electronic cigarettes (commonly called e-cigarettes). The safety of e-cigarettes is unknown, and some may contain harmful chemicals.  If you relapse, do not give up! Plan ahead and think about what you will do the next time you get the urge to smoke. HOW WILL I FEEL WHEN I QUIT SMOKING? You  may have symptoms of withdrawal because your body is used to nicotine (the addictive substance in cigarettes). You may crave cigarettes, be irritable, feel very hungry, cough often, get headaches, or have difficulty concentrating. The withdrawal symptoms are only temporary. They are strongest when you first quit but will go away within 10-14 days. When withdrawal symptoms occur, stay in control. Think about your reasons for quitting. Remind yourself that these are signs that your body is healing and getting used to being without cigarettes. Remember that withdrawal symptoms are easier to treat than the major diseases that smoking can cause.  Even after the withdrawal is over, expect periodic urges to smoke. However, these cravings are generally short lived and will go away whether you smoke or not. Do not smoke! WHAT RESOURCES ARE AVAILABLE TO HELP ME QUIT SMOKING? Your health care provider can direct you to community resources or hospitals for support, which may include:  Group support.  Education.  Hypnosis.  Therapy.   This information is not intended to replace advice given to you by your health care provider. Make sure you discuss any questions you have with your health care provider.   Document Released: 08/14/2004 Document Revised: 12/07/2014 Document Reviewed: 05/04/2013 [employer] Interactive Patient Education 2016 [employer]  

## 2016-07-29 NOTE — Progress Notes (Signed)
BP 133/89 (BP Location: Left Arm, Patient Position: Sitting, Cuff Size: Normal)   Pulse 80   Temp 97.5 F (36.4 C) (Oral)   Ht 5\' 7"  (1.702 m)   Wt 152 lb 6.4 oz (69.1 kg)   BMI 23.87 kg/m    Subjective:    Patient ID: Gabrielle [name], female    DOB: [birth year], 40 y.o.   MRN: [id number]  Gabrielle [name] is a 40 y.o. female presenting on 07/29/2016 for Insect Bite (tick- behind left leg ); Dizziness; Fatigue; Diarrhea; and Loss of Consciousness (2 weeks ago )   HPI Patient here to be established as new patient at [facility].  This patient is [facility] to me [facility]. She comes in today having a tick bite on the posterior left leg. There is no rash associated with it there is a bite wound present but it is well healed. She has expressed issues with fatigue. She has not been eating and drinking well. She had some recent stressors that have decrease her appetite. We'll plan to restart her depression medication. Her medical history is positive for generalized anxiety, fibromyalgia, hypertension, tobacco use, and depression. All of her medications are reviewed today.  Relevant past medical, surgical, family and social history reviewed and updated as indicated. Interim medical history since our last visit reviewed. Allergies and medications reviewed and updated.   Data reviewed from any sources in EPIC.  Review of Systems  Constitutional: Negative.  Negative for activity change, fatigue and fever.  HENT: Negative.   Eyes: Negative.   Respiratory: Negative.  Negative for cough.   Cardiovascular: Negative.  Negative for chest pain.  Gastrointestinal: Negative.  Negative for abdominal pain.  Endocrine: Negative.   Genitourinary: Negative.  Negative for dysuria.  Musculoskeletal: Negative.   Skin: Negative.   Neurological: Negative.     Per HPI unless specifically indicated above  Social History   Social History  . Marital status: Legally  Separated    Spouse name: N/A  . Number of children: N/A  . Years of education: N/A   Occupational History  . Not on file.   Social History Main Topics  . Smoking status: Current Every Day Smoker    Packs/day: 4.00    Years: 28.00  . Smokeless tobacco: Never Used  . Alcohol use No  . Drug use:     Types: Marijuana  . Sexual activity: Not on file   Other Topics Concern  . Not on file   Social History Narrative  . No narrative on file    Past Surgical History:  Procedure Laterality Date  . ABDOMINAL HYSTERECTOMY    . APPENDECTOMY    . CHOLECYSTECTOMY      Family History  Problem Relation Age of Onset  . Fibromyalgia Mother   . Stroke Mother   . Osteoporosis Mother   . Cancer Father   . Heart disease Father       Medication List       Accurate as of 07/29/16 11:28 AM. Always use your most recent med list.          acetaminophen 500 MG tablet Commonly known as:  TYLENOL Take 1,000-1,500 mg by mouth every 6 (six) hours as needed for headache.   ALPRAZolam 0.5 MG tablet Commonly known as:  XANAX Take 0.5 mg by mouth 4 (four) times daily as needed for anxiety.   amoxicillin 500 MG capsule Commonly known as:  AMOXIL Take 1 capsule (500  mg total) by mouth 3 (three) times daily.   citalopram 20 MG tablet Commonly known as:  CELEXA Take 1 tablet (20 mg total) by mouth daily.   HYDROcodone-acetaminophen 10-325 MG tablet Commonly known as:  NORCO Take 1 tablet by mouth 3 (three) times daily as needed for moderate pain.   lisinopril 20 MG tablet Commonly known as:  PRINIVIL,ZESTRIL Take 20 mg by mouth daily.          Objective:    BP 133/89 (BP Location: Left Arm, Patient Position: Sitting, Cuff Size: Normal)   Pulse 80   Temp 97.5 F (36.4 C) (Oral)   Ht 5\' 7"  (1.702 m)   Wt 152 lb 6.4 oz (69.1 kg)   BMI 23.87 kg/m   Allergies  Allergen Reactions  . Percocet [Oxycodone-Acetaminophen] Other (See Comments)    Chest pain    Wt Readings from  Last 3 Encounters:  07/29/16 152 lb 6.4 oz (69.1 kg)  08/28/15 153 lb (69.4 kg)  08/07/15 153 lb (69.4 kg)    Physical Exam  Constitutional: She is oriented to person, place, and time. She appears well-developed and well-nourished.  HENT:  Head: Normocephalic and atraumatic.  Right Ear: Tympanic membrane, external ear and ear canal normal.  Left Ear: Tympanic membrane is erythematous and bulging.  Eyes: Conjunctivae and EOM are normal. Pupils are equal, round, and reactive to light.  Neck: Normal range of motion. Neck supple.  Cardiovascular: Normal rate, regular rhythm, normal heart sounds and intact distal pulses.   Pulmonary/Chest: Effort normal and breath sounds normal.  Abdominal: Soft. Bowel sounds are normal.  Neurological: She is alert and oriented to person, place, and time. She has normal reflexes.  Skin: Skin is warm and dry. Lesion noted. No bruising, no ecchymosis and no rash noted. She is not diaphoretic. No pallor.  POSTERIOR left knee, tan, healing  Psychiatric: She has a normal mood and affect. Her behavior is normal. Judgment and thought content normal.        Assessment & Plan:   1. Generalized anxiety disorder - citalopram (CELEXA) 20 MG tablet; Take 1 tablet (20 mg total) by mouth daily.  Dispense: 30 tablet; Refill: 5  2. Fibromyalgia muscle pain Walking, stretching Hydrocodone prn only  3. Essential hypertension continue lisinopril  4. Tobacco use disorder Encouraged cessation, information given  5. Tick bite Report and neuro changes, fever, rashes - amoxicillin (AMOXIL) 500 MG capsule; Take 1 capsule (500 mg total) by mouth 3 (three) times daily.  Dispense: 30 capsule; Refill: 0  6. Other fatigue Get regular sleep  7. Syncope and collapse Note: from not eating and drinking well. Push fluids, meal shakes, eggs. Report if no improvement.  8. Depression   Continue all other maintenance medications as listed above.  Follow up plan: 3 months  for recheck  Remus Loffler PA-C [facility] [address]  [place], [place] [address] [phone]   07/29/2016, 11:28 AM

## 2016-09-28 ENCOUNTER — Telehealth: Payer: Self-pay | Admitting: Physician Assistant

## 2016-09-28 MED ORDER — SULFAMETHOXAZOLE-TRIMETHOPRIM 800-160 MG PO TABS: 1.0000 | ORAL_TABLET | Freq: Two times a day (BID) | ORAL | 0 refills | Status: DC

## 2016-09-28 NOTE — Telephone Encounter (Signed)
She should be able to take Bactrim DS 1 tab po BID 10 days for UTI. This is not an allergy for her.

## 2016-09-28 NOTE — Telephone Encounter (Signed)
Patient was being treated for bacterial infection in urine. I have called [facility] and requested records.

## 2016-09-28 NOTE — Telephone Encounter (Signed)
Patient aware and rx sent to pharmacy.  

## 2016-11-27 ENCOUNTER — Other Ambulatory Visit: Payer: Self-pay | Admitting: Physician Assistant

## 2016-11-29 NOTE — Telephone Encounter (Signed)
Last filled 09/30/16, last seen 07/29/16. Route to pool for call in

## 2016-12-02 ENCOUNTER — Other Ambulatory Visit: Payer: Self-pay

## 2016-12-02 MED ORDER — FLUCONAZOLE 150 MG PO TABS: 150.0000 mg | ORAL_TABLET | Freq: Once | ORAL | 0 refills | Status: AC

## 2016-12-02 NOTE — Telephone Encounter (Signed)
Rx called in to pharmacy. 

## 2016-12-14 ENCOUNTER — Ambulatory Visit (INDEPENDENT_AMBULATORY_CARE_PROVIDER_SITE_OTHER): Payer: Self-pay | Admitting: Pediatrics

## 2016-12-14 ENCOUNTER — Ambulatory Visit: Payer: Self-pay | Admitting: Family Medicine

## 2016-12-14 ENCOUNTER — Encounter: Payer: Self-pay | Admitting: Pediatrics

## 2016-12-14 ENCOUNTER — Encounter (INDEPENDENT_AMBULATORY_CARE_PROVIDER_SITE_OTHER): Payer: Self-pay

## 2016-12-14 VITALS — BP 145/93 | HR 71 | Temp 98.5°F | Resp 20 | Ht 67.0 in | Wt 152.4 lb

## 2016-12-14 DIAGNOSIS — N949 Unspecified condition associated with female genital organs and menstrual cycle: Secondary | ICD-10-CM

## 2016-12-14 DIAGNOSIS — J069 Acute upper respiratory infection, unspecified: Secondary | ICD-10-CM

## 2016-12-14 DIAGNOSIS — H6503 Acute serous otitis media, bilateral: Secondary | ICD-10-CM

## 2016-12-14 DIAGNOSIS — N898 Other specified noninflammatory disorders of vagina: Secondary | ICD-10-CM

## 2016-12-14 MED ORDER — FLUCONAZOLE 150 MG PO TABS: 150.0000 mg | ORAL_TABLET | ORAL | 0 refills | Status: DC | PRN

## 2016-12-14 MED ORDER — AMOXICILLIN 500 MG PO CAPS: 500.0000 mg | ORAL_CAPSULE | Freq: Two times a day (BID) | ORAL | 0 refills | Status: DC

## 2016-12-14 NOTE — Patient Instructions (Signed)
Netipot with distilled water 2-3 times a day to clear out sinuses Or Normal saline nasal spray Flonase steroid nasal spray daily Antihistamine daily such as cetirizine Acetaminophen regularly Lots of fluids  Start antibiotic if ear pain not improving over the next few days

## 2016-12-14 NOTE — Progress Notes (Signed)
  Subjective:   Patient ID: Gabrielle [name], female    DOB: 10/08/1976, 41 y.o.   MRN: [id number] CC: Ear Pain; Sore Throat; and Cough  HPI: Gabrielle [name] is a 41 y.o. female presenting for Ear Pain; Sore Throat; and Cough  Started having symptoms yesterday No fevers Lots of congestion Appetite down some Feeling sinus pressure, ear pressure  New sexual partner over past couple of months Was on abx one month ago, has had thick white discharge since then Has noticed small lump just inside L labia majora present for several months, doesn't hurt, not red, no drainage, not changing  Relevant past medical, surgical, family and social history reviewed. Allergies and medications reviewed and updated. History  Smoking Status  . Current Every Day Smoker  . Packs/day: 4.00  . Years: 28.00  Smokeless Tobacco  . Never Used   ROS: Per HPI   Objective:    BP (!) 145/93   Pulse 71   Temp 98.5 F (36.9 C) (Oral)   Resp 20   Ht 5\' 7"  (1.702 m)   Wt 152 lb 6.4 oz (69.1 kg)   SpO2 100%   BMI 23.87 kg/m   Wt Readings from Last 3 Encounters:  12/14/16 152 lb 6.4 oz (69.1 kg)  07/29/16 152 lb 6.4 oz (69.1 kg)  08/28/15 153 lb (69.4 kg)    Gen: NAD, alert, cooperative with exam, NCAT EYES: EOMI, no conjunctival injection, or no icterus ENT:  TMs dull R side with clear effusion, L side slightly red, bulging with fluid, OP without erythema LYMPH: small < 1cm cervical LAD CV: NRRR, normal S1/S2, no murmur, distal pulses 2+ b/l Resp: CTABL, no wheezes, normal WOB Abd: +BS, soft, NTND. no guarding or organomegaly Ext: No edema, warm Neuro: Alert and oriented GU: normal external female genitalia, not visible but palpable apprx 2mm superficial nodule at proximal L labia majora, easily mobile, non-tender  Assessment & Plan:  Gabrielle [name] was seen today for ear pain, sore throat and cough.  Diagnoses and all orders for this visit:  Acute URI sympatomatic care discussed   Bilateral acute  serous otitis media, recurrence not specified -     amoxicillin (AMOXIL) 500 MG capsule; Take 1 capsule (500 mg total) by mouth 2 (two) times daily.  Vaginal discharge Needs STI testing, pt self pay, will go to HD dept Thick white discharge started after recent abx use, will treat with fluconazole  Genital lesion, female Easily mobile small nodule inside labia majora, non-tender Possible inclusion cyst, will follow, gave return precautions  Other orders -     fluconazole (DIFLUCAN) 150 MG tablet; Take 1 tablet (150 mg total) by mouth every three (3) days as needed.   Follow up plan: prn Rex Krasarol Chrishonda Hesch, MD [facility]

## 2016-12-23 ENCOUNTER — Telehealth: Payer: Self-pay | Admitting: Physician Assistant

## 2016-12-23 MED ORDER — CITALOPRAM HYDROBROMIDE 20 MG PO TABS: 20.0000 mg | ORAL_TABLET | Freq: Every day | ORAL | 5 refills | Status: DC

## 2016-12-23 MED ORDER — ALPRAZOLAM 0.5 MG PO TABS: 0.5000 mg | ORAL_TABLET | Freq: Two times a day (BID) | ORAL | 0 refills | Status: DC | PRN

## 2016-12-23 NOTE — Telephone Encounter (Signed)
Prescription sent to pharmacy.

## 2016-12-23 NOTE — Telephone Encounter (Signed)
Patient aware and rx for alprazolam called into pharmacy.

## 2016-12-24 ENCOUNTER — Ambulatory Visit (INDEPENDENT_AMBULATORY_CARE_PROVIDER_SITE_OTHER): Payer: Self-pay | Admitting: Physician Assistant

## 2016-12-24 ENCOUNTER — Telehealth: Payer: Self-pay | Admitting: Physician Assistant

## 2016-12-24 ENCOUNTER — Encounter: Payer: Self-pay | Admitting: Physician Assistant

## 2016-12-24 VITALS — BP 114/82 | HR 94 | Temp 97.2°F | Ht 67.0 in | Wt 145.0 lb

## 2016-12-24 DIAGNOSIS — I10 Essential (primary) hypertension: Secondary | ICD-10-CM

## 2016-12-24 DIAGNOSIS — M545 Low back pain: Secondary | ICD-10-CM

## 2016-12-24 DIAGNOSIS — G8929 Other chronic pain: Secondary | ICD-10-CM

## 2016-12-24 DIAGNOSIS — F411 Generalized anxiety disorder: Secondary | ICD-10-CM

## 2016-12-24 NOTE — Telephone Encounter (Signed)
Pt has elevated BP Slight cp x 2 wks appt scheduled Pt instructed to call 911 with worsening sxs Verbalizes understanding

## 2016-12-24 NOTE — Patient Instructions (Signed)
Generalized Anxiety Disorder Generalized anxiety disorder (GAD) is a mental disorder. It interferes with life functions, including relationships, work, and school. GAD is different from normal anxiety, which everyone experiences at some point in their lives in response to specific life events and activities. Normal anxiety actually helps us prepare for and get through these life events and activities. Normal anxiety goes away after the event or activity is over.  GAD causes anxiety that is not necessarily related to specific events or activities. It also causes excess anxiety in proportion to specific events or activities. The anxiety associated with GAD is also difficult to control. GAD can vary from mild to severe. People with severe GAD can have intense waves of anxiety with physical symptoms (panic attacks).  SYMPTOMS The anxiety and worry associated with GAD are difficult to control. This anxiety and worry are related to many life events and activities and also occur more days than not for 6 months or longer. People with GAD also have three or more of the following symptoms (one or more in children):  Restlessness.   Fatigue.  Difficulty concentrating.   Irritability.  Muscle tension.  Difficulty sleeping or unsatisfying sleep. DIAGNOSIS GAD is diagnosed through an assessment by your health care provider. Your health care provider will ask you questions aboutyour mood,physical symptoms, and events in your life. Your health care provider may ask you about your medical history and use of alcohol or drugs, including prescription medicines. Your health care provider may also do a physical exam and blood tests. Certain medical conditions and the use of certain substances can cause symptoms similar to those associated with GAD. Your health care provider may refer you to a mental health specialist for further evaluation. TREATMENT The following therapies are usually used to treat GAD:    Medication. Antidepressant medication usually is prescribed for long-term daily control. Antianxiety medicines may be added in severe cases, especially when panic attacks occur.   Talk therapy (psychotherapy). Certain types of talk therapy can be helpful in treating GAD by providing support, education, and guidance. A form of talk therapy called cognitive behavioral therapy can teach you healthy ways to think about and react to daily life events and activities.  Stress managementtechniques. These include yoga, meditation, and exercise and can be very helpful when they are practiced regularly. A mental health specialist can help determine which treatment is best for you. Some people see improvement with one therapy. However, other people require a combination of therapies. This information is not intended to replace advice given to you by your health care provider. Make sure you discuss any questions you have with your health care provider. Document Released: 03/13/2013 Document Revised: 12/07/2014 Document Reviewed: 03/13/2013 Elsevier Interactive Patient Education  2017 Elsevier Inc.  

## 2016-12-24 NOTE — Progress Notes (Signed)
BP 114/82   Pulse 94   Temp 97.2 F (36.2 C) (Oral)   Ht 5\' 7"  (1.702 m)   Wt 145 lb (65.8 kg)   BMI 22.71 kg/m    Subjective:    Patient ID: Gabrielle [name], female    DOB: 1976/03/25, 41 y.o.   MRN: [id number]  HPI: Gabrielle [name] is a 41 y.o. female presenting on 12/24/2016 for Depression; Hypertension; and Anxiety  Patient is having significant anxiety and depression at this time. She had not been taking any medication. We sent her medication in 2 days ago to restart. She has restarted citalopram 20 mg 1 daily and has used the Xanax twice a day as needed. She has been having elevated blood pressure checks and they were up today. She denies any severe chest pain, shortness of breath, diaphoresis. She has had some recent changes in her relationships and is very distraught over this.  Past Medical History:  Diagnosis Date  . Anxiety   . Fibromyalgia   . Fibromyalgia   . Hypertension    Relevant past medical, surgical, family and social history reviewed and updated as indicated. Interim medical history since our last visit reviewed. Allergies and medications reviewed and updated. DATA REVIEWED: CHART IN EPIC  Social History   Social History  . Marital status: Legally Separated    Spouse name: N/A  . Number of children: N/A  . Years of education: N/A   Occupational History  . Not on file.   Social History Main Topics  . Smoking status: Current Every Day Smoker    Packs/day: 4.00    Years: 28.00  . Smokeless tobacco: Never Used  . Alcohol use No  . Drug use: Yes    Types: Marijuana  . Sexual activity: Not on file   Other Topics Concern  . Not on file   Social History Narrative  . No narrative on file    Past Surgical History:  Procedure Laterality Date  . ABDOMINAL HYSTERECTOMY    . APPENDECTOMY    . CHOLECYSTECTOMY      Family History  Problem Relation Age of Onset  . Fibromyalgia Mother   . Stroke Mother   . Osteoporosis Mother   . Cancer  Father   . Heart disease Father     Review of Systems  Constitutional: Negative.  Negative for activity change, fatigue and fever.  HENT: Negative.   Eyes: Negative.   Respiratory: Negative.  Negative for cough.   Cardiovascular: Negative.  Negative for chest pain.  Gastrointestinal: Negative.  Negative for abdominal pain.  Endocrine: Negative.   Genitourinary: Negative.  Negative for dysuria.  Musculoskeletal: Negative.   Skin: Negative.   Neurological: Negative.   Psychiatric/Behavioral: Positive for decreased concentration and dysphoric mood. Negative for agitation, behavioral problems and suicidal ideas. The patient is nervous/anxious.     Allergies as of 12/24/2016      Reactions   Percocet [oxycodone-acetaminophen] Other (See Comments)   Chest pain       Medication List       Accurate as of 12/24/16 12:11 PM. Always use your most recent med list.          acetaminophen 500 MG tablet Commonly known as:  TYLENOL Take 1,000-1,500 mg by mouth every 6 (six) hours as needed for headache.   ALPRAZolam 0.5 MG tablet Commonly known as:  XANAX Take 1 tablet (0.5 mg total) by mouth 2 (two) times daily as needed for anxiety.   citalopram  20 MG tablet Commonly known as:  CELEXA Take 1 tablet (20 mg total) by mouth daily.   lisinopril 40 MG tablet Commonly known as:  PRINIVIL,ZESTRIL Take 40 mg by mouth daily.          Objective:    BP 114/82   Pulse 94   Temp 97.2 F (36.2 C) (Oral)   Ht 5\' 7"  (1.702 m)   Wt 145 lb (65.8 kg)   BMI 22.71 kg/m   Allergies  Allergen Reactions  . Percocet [Oxycodone-Acetaminophen] Other (See Comments)    Chest pain     Wt Readings from Last 3 Encounters:  12/24/16 145 lb (65.8 kg)  12/14/16 152 lb 6.4 oz (69.1 kg)  07/29/16 152 lb 6.4 oz (69.1 kg)    Physical Exam  Constitutional: She is oriented to person, place, and time. She appears well-developed and well-nourished.  HENT:  Head: Normocephalic and atraumatic.    Eyes: Conjunctivae and EOM are normal. Pupils are equal, round, and reactive to light.  Cardiovascular: Normal rate, regular rhythm, normal heart sounds and intact distal pulses.   Pulmonary/Chest: Effort normal and breath sounds normal.  Abdominal: Soft. Bowel sounds are normal.  Neurological: She is alert and oriented to person, place, and time. She has normal reflexes.  Skin: Skin is warm and dry. No rash noted.  Psychiatric: She has a normal mood and affect. Her behavior is normal. Judgment and thought content normal.  Nursing note and vitals reviewed.       Assessment & Plan:   1. Essential hypertension Continue lisinopril 40 mg one daily  2. Chronic bilateral low back pain without sciatica  3. Generalized anxiety disorder Continue Citalopram 20 mg 1 daily Alprazolam 0.5 mg 1 tablet up to twice daily as needed for anxiety  Continue all other maintenance medications as listed above.  Follow up plan: Return in about 2 months (around 02/21/2017) for recheck.  No orders of the defined types were placed in this encounter.   Educational handout given for anxiety   Remus LofflerAngel S. Soliyana Mcchristian PA-C [facility] [address]  [place], [place] [address] [phone]   12/24/2016, 12:11 PM

## 2017-01-06 ENCOUNTER — Telehealth: Payer: Self-pay | Admitting: Physician Assistant

## 2017-01-06 MED ORDER — PREDNISONE 10 MG (21) PO TBPK: ORAL_TABLET | ORAL | 0 refills | Status: DC

## 2017-01-06 NOTE — Telephone Encounter (Signed)
Left messgae- Rx requested sent to pharmacy.

## 2017-02-04 ENCOUNTER — Telehealth: Payer: Self-pay | Admitting: Physician Assistant

## 2017-02-04 NOTE — Telephone Encounter (Signed)
What is the name of the medication? Xanax   Have you contacted your pharmacy to request a refill? yes  Which pharmacy would you like this sent to? [facility] [place].   Patient notified that their request is being sent to the clinical staff for review and that they should receive a call once it is complete. If they do not receive a call within 24 hours they can check with their pharmacy or our office.

## 2017-02-05 MED ORDER — ALPRAZOLAM 0.5 MG PO TABS: 0.5000 mg | ORAL_TABLET | Freq: Two times a day (BID) | ORAL | 2 refills | Status: DC | PRN

## 2017-02-05 NOTE — Telephone Encounter (Signed)
Pt aware Rx called into [facility]

## 2017-02-06 ENCOUNTER — Other Ambulatory Visit: Payer: Self-pay | Admitting: Physician Assistant

## 2017-02-06 MED ORDER — CITALOPRAM HYDROBROMIDE 40 MG PO TABS: 20.0000 mg | ORAL_TABLET | Freq: Every day | ORAL | 5 refills | Status: DC

## 2017-02-10 IMAGING — DX DG CHEST 2V
2 series · 2 of 2 positions shown · non-contrast
Comparison: 04/20/2015

CLINICAL DATA: Chest pain, shortness of Breath, history of panic
attacks

EXAM:
CHEST  2 VIEW

[chest pa]
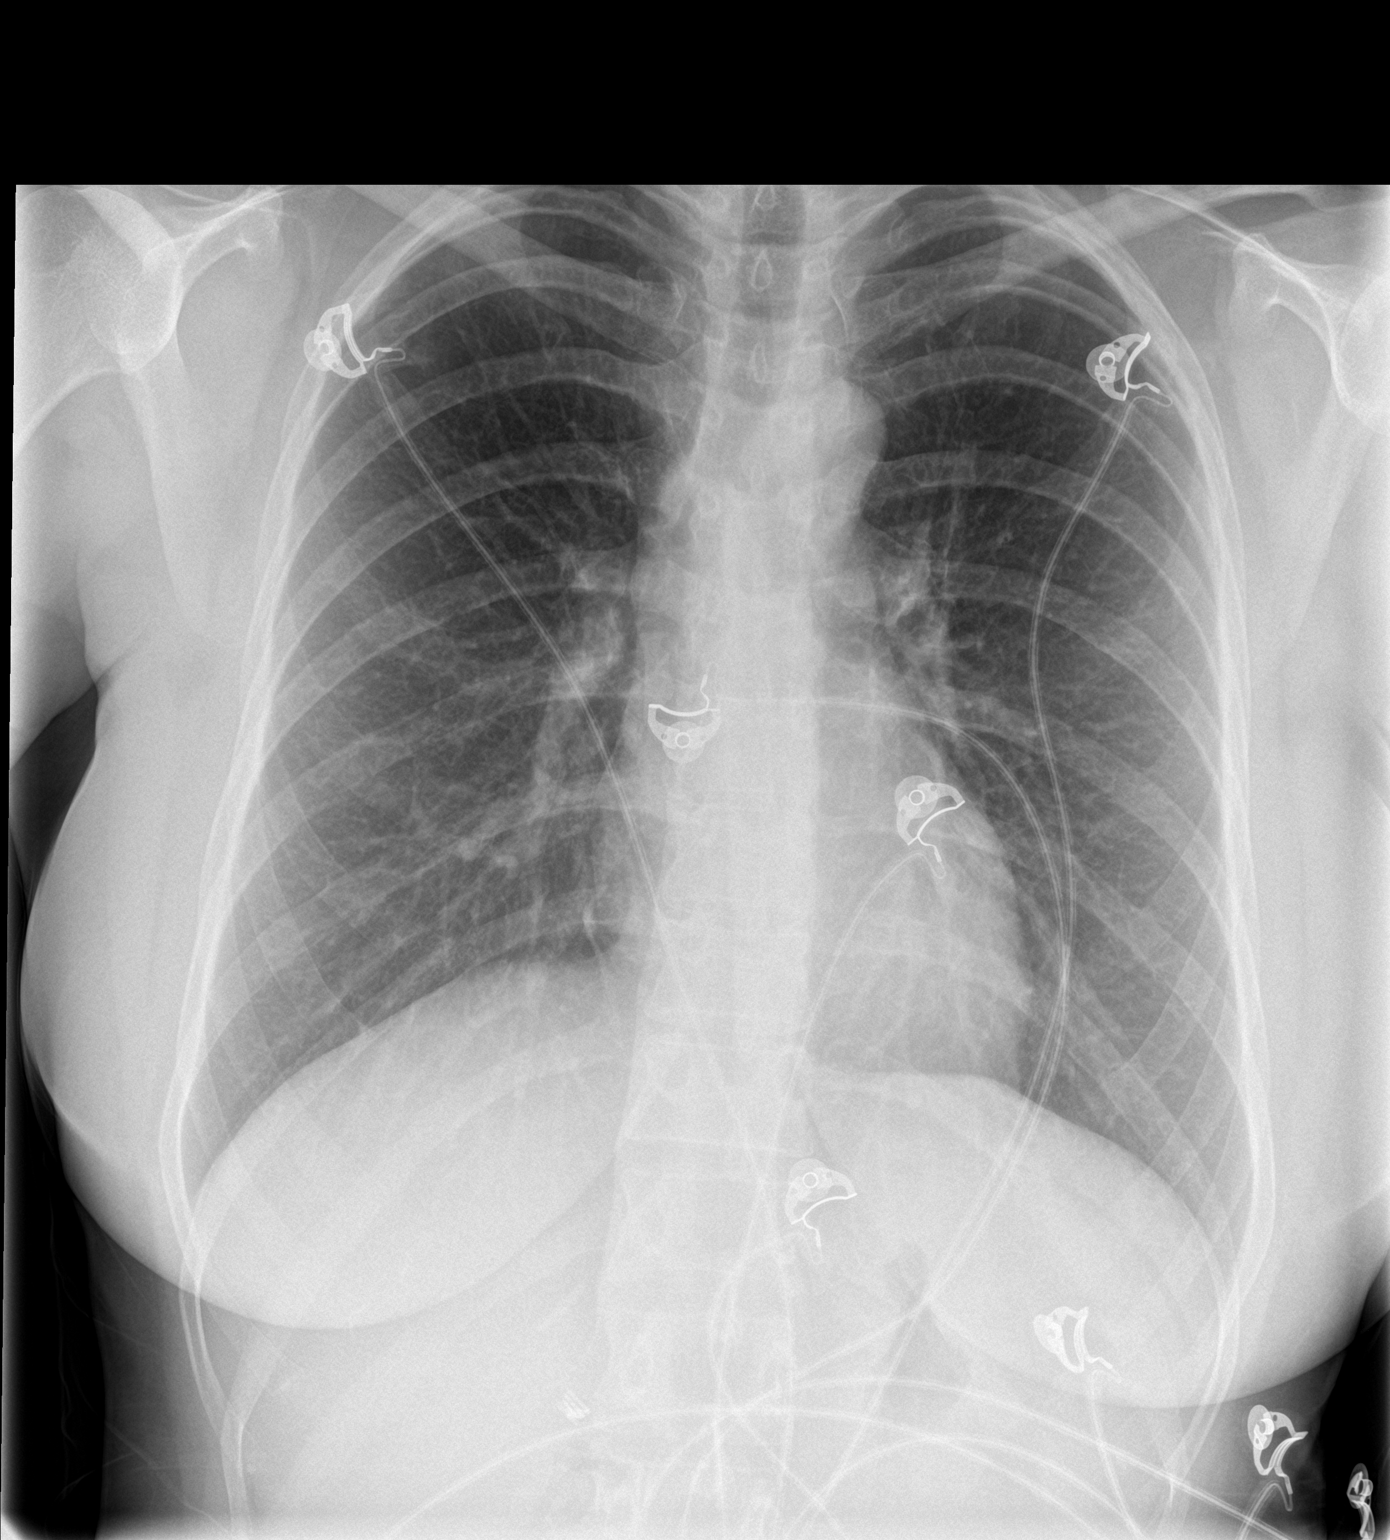

[chest lat]
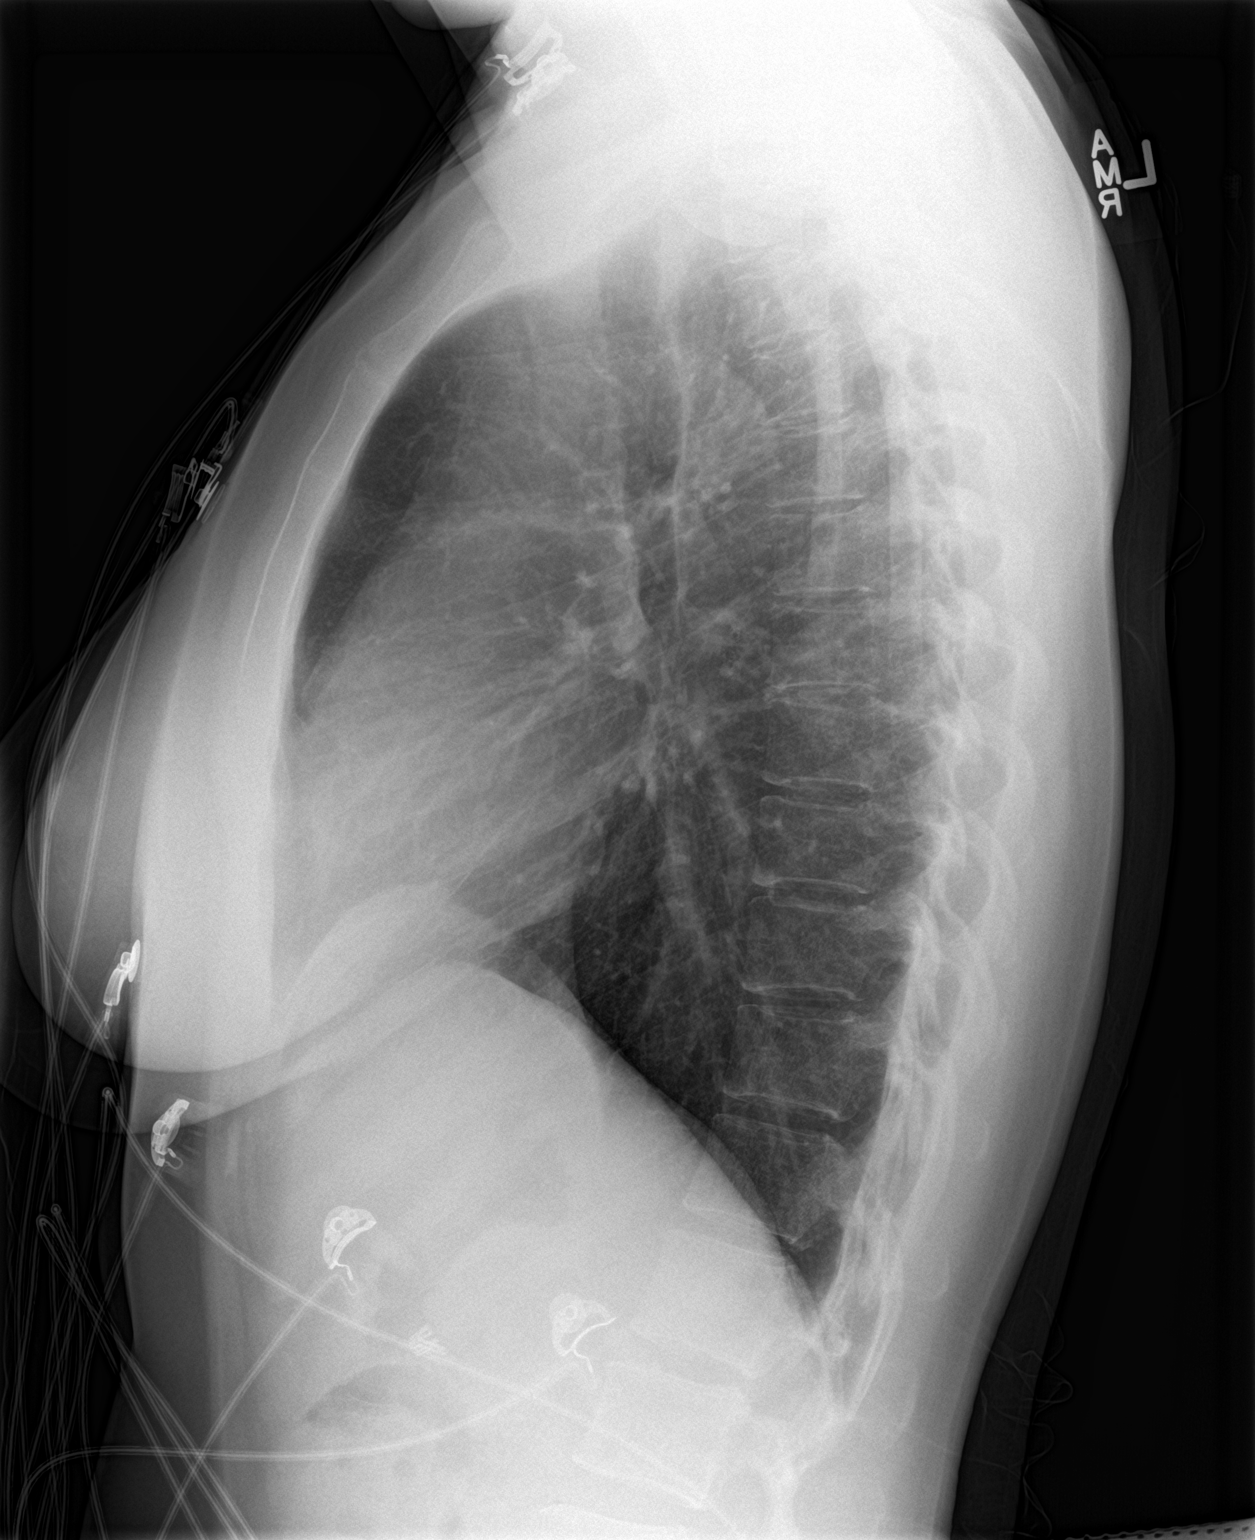

[2 of 2 positions shown; findings below may reference images not displayed]

FINDINGS: Cardiomediastinal silhouette is stable. Minimal mid thoracic
dextroscoliosis. No acute infiltrate or pleural effusion. No
pulmonary edema. Bony thorax is unremarkable.
IMPRESSION: No active cardiopulmonary disease.

## 2017-03-26 ENCOUNTER — Telehealth: Payer: Self-pay | Admitting: Physician Assistant

## 2017-03-26 MED ORDER — NITROGLYCERIN 0.4 MG SL SUBL: 0.4000 mg | SUBLINGUAL_TABLET | SUBLINGUAL | 3 refills | Status: DC | PRN

## 2017-03-26 NOTE — Telephone Encounter (Signed)
Pt has continued chest pain with elevated BP Pt does not have insurance Does not want to go back to ED Wants appt with [facility] Please advise

## 2017-03-26 NOTE — Telephone Encounter (Signed)
Need to know the names of the medications.

## 2017-03-26 NOTE — Telephone Encounter (Signed)
LM  - what meds?

## 2017-03-26 NOTE — Telephone Encounter (Signed)
156/105 - chest hurts = went to ER They were giving Nitro - Dr said she may need nitro on hand at home --- we said she may need referral to cardio and rx for nitro  Send to [facility]  (she doesn't need samples)

## 2017-04-05 ENCOUNTER — Telehealth: Payer: Self-pay

## 2017-04-05 NOTE — Telephone Encounter (Signed)
There is a message in the pool about sending her for cardio. Not sure what happened.

## 2017-04-06 ENCOUNTER — Other Ambulatory Visit: Payer: Self-pay

## 2017-04-06 DIAGNOSIS — R079 Chest pain, unspecified: Secondary | ICD-10-CM

## 2017-05-03 ENCOUNTER — Encounter: Payer: Self-pay | Admitting: Cardiology

## 2017-05-03 ENCOUNTER — Ambulatory Visit: Payer: Self-pay | Admitting: Cardiology

## 2017-05-03 NOTE — Progress Notes (Deleted)
     Clinical Summary Gabrielle [name] is a 41 y.o.female 1. Chest pain -   Past Medical History:  Diagnosis Date  . Anxiety   . Fibromyalgia   . Fibromyalgia   . Hypertension      Allergies  Allergen Reactions  . Percocet [Oxycodone-Acetaminophen] Other (See Comments)    Chest pain      Current Outpatient Prescriptions  Medication Sig Dispense Refill  . acetaminophen (TYLENOL) 500 MG tablet Take 1,000-1,500 mg by mouth every 6 (six) hours as needed for headache.    . ALPRAZolam (XANAX) 0.5 MG tablet Take 1 tablet (0.5 mg total) by mouth 2 (two) times daily as needed for anxiety. 60 tablet 2  . citalopram (CELEXA) 40 MG tablet Take 0.5 tablets (20 mg total) by mouth daily. 30 tablet 5  . lisinopril (PRINIVIL,ZESTRIL) 40 MG tablet Take 40 mg by mouth daily.     . nitroGLYCERIN (NITROSTAT) 0.4 MG SL tablet Place 1 tablet (0.4 mg total) under the tongue every 5 (five) minutes as needed for chest pain. After 3 doses if no relief, call 911. 20 tablet 3  . predniSONE (STERAPRED UNI-PAK 21 TAB) 10 MG (21) TBPK tablet As directed x 6 days 21 tablet 0   No current facility-administered medications for this visit.      Past Surgical History:  Procedure Laterality Date  . ABDOMINAL HYSTERECTOMY    . APPENDECTOMY    . CHOLECYSTECTOMY       Allergies  Allergen Reactions  . Percocet [Oxycodone-Acetaminophen] Other (See Comments)    Chest pain       Family History  Problem Relation Age of Onset  . Fibromyalgia Mother   . Stroke Mother   . Osteoporosis Mother   . Cancer Father   . Heart disease Father      Social History Gabrielle [name] reports that she has been smoking.  She has a 112.00 pack-year smoking history. She has never used smokeless tobacco. Gabrielle [name] reports that she does not drink alcohol.   Review of Systems CONSTITUTIONAL: No weight loss, fever, chills, weakness or fatigue.  HEENT: Eyes: No visual loss, blurred vision, double vision or yellow sclerae.No  hearing loss, sneezing, congestion, runny nose or sore throat.  SKIN: No rash or itching.  CARDIOVASCULAR:  RESPIRATORY: No shortness of breath, cough or sputum.  GASTROINTESTINAL: No anorexia, nausea, vomiting or diarrhea. No abdominal pain or blood.  GENITOURINARY: No burning on urination, no polyuria NEUROLOGICAL: No headache, dizziness, syncope, paralysis, ataxia, numbness or tingling in the extremities. No change in bowel or bladder control.  MUSCULOSKELETAL: No muscle, back pain, joint pain or stiffness.  LYMPHATICS: No enlarged nodes. No history of splenectomy.  PSYCHIATRIC: No history of depression or anxiety.  ENDOCRINOLOGIC: No reports of sweating, cold or heat intolerance. No polyuria or polydipsia.  Marland Kitchen.   Physical Examination There were no vitals filed for this visit. There were no vitals filed for this visit.  Gen: resting comfortably, no acute distress HEENT: no scleral icterus, pupils equal round and reactive, no palptable cervical adenopathy,  CV Resp: Clear to auscultation bilaterally GI: abdomen is soft, non-tender, non-distended, normal bowel sounds, no hepatosplenomegaly MSK: extremities are warm, no edema.  Skin: warm, no rash Neuro:  no focal deficits Psych: appropriate affect   Diagnostic Studies     Assessment and Plan        Antoine PocheJonathan F. Saliou Barnier, M.D., F.A.C.C.

## 2017-06-03 ENCOUNTER — Other Ambulatory Visit: Payer: Self-pay | Admitting: Pediatrics

## 2017-06-03 MED ORDER — PERMETHRIN 5 % EX CREA: TOPICAL_CREAM | CUTANEOUS | 0 refills | Status: DC

## 2017-06-03 NOTE — Progress Notes (Signed)
Pt notified of RX 

## 2017-06-03 NOTE — Progress Notes (Unsigned)
Boyfriends son with symptoms of scabies, now pt with symptoms, sent in permethrin

## 2017-06-16 ENCOUNTER — Ambulatory Visit: Payer: Self-pay | Admitting: Physician Assistant

## 2017-08-28 ENCOUNTER — Other Ambulatory Visit: Payer: Self-pay | Admitting: Physician Assistant

## 2017-08-28 MED ORDER — GABAPENTIN 300 MG PO CAPS: 300.0000 mg | ORAL_CAPSULE | Freq: Two times a day (BID) | ORAL | 3 refills | Status: DC

## 2017-11-12 ENCOUNTER — Ambulatory Visit: Payer: BLUE CROSS/BLUE SHIELD | Admitting: Family Medicine

## 2017-11-12 ENCOUNTER — Encounter: Payer: Self-pay | Admitting: Family Medicine

## 2017-11-12 VITALS — BP 125/86 | HR 89 | Temp 97.9°F | Ht 67.0 in | Wt 149.0 lb

## 2017-11-12 DIAGNOSIS — J189 Pneumonia, unspecified organism: Secondary | ICD-10-CM | POA: Diagnosis not present

## 2017-11-12 LAB — VERITOR FLU A/B WAIVED
INFLUENZA B: NEGATIVE
Influenza A: NEGATIVE

## 2017-11-12 MED ORDER — PREDNISONE 10 MG (21) PO TBPK: ORAL_TABLET | ORAL | 0 refills | Status: DC

## 2017-11-12 MED ORDER — AZITHROMYCIN 250 MG PO TABS: ORAL_TABLET | ORAL | 0 refills | Status: DC

## 2017-11-12 NOTE — Patient Instructions (Signed)
Great to see you!   Walking Pneumonia, Adult Pneumonia is an infection of the lungs. There are different types of pneumonia. One type can develop while a person is in a hospital. A different type, called community-acquired pneumonia, develops in people who are not, or have not recently been, in the hospital or other health care facility. What are the causes? Pneumonia may be caused by bacteria, viruses, or funguses. Community-acquired pneumonia is often caused by Streptococcus pneumonia bacteria. These bacteria are often passed from one person to another by breathing in droplets from the cough or sneeze of an infected person. What increases the risk? The condition is more likely to develop in:  People who havechronic diseases, such as chronic obstructive pulmonary disease (COPD), asthma, congestive heart failure, cystic fibrosis, diabetes, or kidney disease.  People who haveearly-stage or late-stage HIV.  People who havesickle cell disease.  People who havehad their spleen removed (splenectomy).  People who havepoor dental hygiene.  People who havemedical conditions that increase the risk of breathing in (aspirating) secretions their own mouth and nose.  People who havea weakened immune system (immunocompromised).  People who smoke.  People whotravel to areas where pneumonia-causing germs commonly exist.  People whoare around animal habitats or animals that have pneumonia-causing germs, including birds, bats, rabbits, cats, and farm animals.  What are the signs or symptoms? Symptoms of this condition include:  Adry cough.  A wet (productive) cough.  Fever.  Sweating.  Chest pain, especially when breathing deeply or coughing.  Rapid breathing or difficulty breathing.  Shortness of breath.  Shaking chills.  Fatigue.  Muscle aches.  How is this diagnosed? Your health care provider will take a medical history and perform a physical exam. You may also have  other tests, including:  Imaging studies of your chest, including X-rays.  Tests to check your blood oxygen level and other blood gases.  Other tests on blood, mucus (sputum), fluid around your lungs (pleural fluid), and urine.  If your pneumonia is severe, other tests may be done to identify the specific cause of your illness. How is this treated? The type of treatment that you receive depends on many factors, such as the cause of your pneumonia, the medicines you take, and other medical conditions that you have. For most adults, treatment and recovery from pneumonia may occur at home. In some cases, treatment must happen in a hospital. Treatment may include:  Antibiotic medicines, if the pneumonia was caused by bacteria.  Antiviral medicines, if the pneumonia was caused by a virus.  Medicines that are given by mouth or through an IV tube.  Oxygen.  Respiratory therapy.  Although rare, treating severe pneumonia may include:  Mechanical ventilation. This is done if you are not breathing well on your own and you cannot maintain a safe blood oxygen level.  Thoracentesis. This procedureremoves fluid around one lung or both lungs to help you breathe better.  Follow these instructions at home:  Take over-the-counter and prescription medicines only as told by your health care provider. ? Only takecough medicine if you are losing sleep. Understand that cough medicine can prevent your body's natural ability to remove mucus from your lungs. ? If you were prescribed an antibiotic medicine, take it as told by your health care provider. Do not stop taking the antibiotic even if you start to feel better.  Sleep in a semi-upright position at night. Try sleeping in a reclining chair, or place a few pillows under your head.  Do not   use tobacco products, including cigarettes, chewing tobacco, and e-cigarettes. If you need help quitting, ask your health care provider.  Drink enough water to  keep your urine clear or pale yellow. This will help to thin out mucus secretions in your lungs. How is this prevented? There are ways that you can decrease your risk of developing community-acquired pneumonia. Consider getting a pneumococcal vaccine if:  You are older than 43 years of age.  You are older than 43 years of age and are undergoing cancer treatment, have chronic lung disease, or have other medical conditions that affect your immune system. Ask your health care provider if this applies to you.  There are different types and schedules of pneumococcal vaccines. Ask your health care provider which vaccination option is best for you. You may also prevent community-acquired pneumonia if you take these actions:  Get an influenza vaccine every year. Ask your health care provider which type of influenza vaccine is best for you.  Go to the dentist on a regular basis.  Wash your hands often. Use hand sanitizer if soap and water are not available.  Contact a health care provider if:  You have a fever.  You are losing sleep because you cannot control your cough with cough medicine. Get help right away if:  You have worsening shortness of breath.  You have increased chest pain.  Your sickness becomes worse, especially if you are an older adult or have a weakened immune system.  You cough up blood. This information is not intended to replace advice given to you by your health care provider. Make sure you discuss any questions you have with your health care provider. Document Released: 11/16/2005 Document Revised: 03/26/2016 Document Reviewed: 03/13/2015 Elsevier Interactive Patient Education  2017 [employer] Inc[employer]  

## 2017-11-12 NOTE — Progress Notes (Signed)
   HPI  Patient presents today with flulike illness.  Patient explains that she has had loose stools, sore throat, body aches, chills, and cough for about 2 days. She is a 3 pack/day smoker.  She does have SOB  No fever Tolerating PO well.   PMH: Smoking status noted ROS: Per HPI  Objective: BP 125/86   Pulse 89   Temp 97.9 F (36.6 C) (Oral)   Ht 5\' 7"  (1.702 m)   Wt 149 lb (67.6 kg)   SpO2 97%   BMI 23.34 kg/m  Gen: NAD, alert, cooperative with exam HEENT: NCAT, facial pain or pressure, oropharynx moist and clear, nares with swollen turbinates, TMs normal bilaterally CV: RRR, good S1/S2, no murmur Resp: CTABL, no wheezes, non-labored Ext: No edema, warm Neuro: Alert and oriented, No gross deficits  Assessment and plan:  #Atypical pneumonia Flu testing is negative today Cover with azithromycin, given short course of steroids also with heavy smoking and shortness of breath likely underlying bronchitic changes as well. Return to clinic as needed     Orders Placed This Encounter  Procedures  . Veritor Flu A/B Waived    Order Specific Question:   Source    Answer:   nasal    Meds ordered this encounter  Medications  . predniSONE (STERAPRED UNI-PAK 21 TAB) 10 MG (21) TBPK tablet    Sig: As directed x 6 days    Dispense:  21 tablet    Refill:  0  . azithromycin (ZITHROMAX) 250 MG tablet    Sig: Take 2 tablets on day 1 and 1 tablet daily after that    Dispense:  6 tablet    Refill:  0    Murtis SinkSam Bradshaw, MD [facility] 11/12/2017, 10:55 AM

## 2017-12-06 ENCOUNTER — Other Ambulatory Visit: Payer: Self-pay | Admitting: Physician Assistant

## 2017-12-08 ENCOUNTER — Ambulatory Visit: Payer: BLUE CROSS/BLUE SHIELD | Admitting: Physician Assistant

## 2017-12-08 ENCOUNTER — Encounter: Payer: Self-pay | Admitting: Physician Assistant

## 2017-12-08 VITALS — BP 113/84 | HR 76 | Temp 97.8°F | Ht 67.0 in | Wt 154.0 lb

## 2017-12-08 DIAGNOSIS — M797 Fibromyalgia: Secondary | ICD-10-CM

## 2017-12-08 DIAGNOSIS — F419 Anxiety disorder, unspecified: Secondary | ICD-10-CM

## 2017-12-08 DIAGNOSIS — F339 Major depressive disorder, recurrent, unspecified: Secondary | ICD-10-CM | POA: Diagnosis not present

## 2017-12-08 MED ORDER — CITALOPRAM HYDROBROMIDE 20 MG PO TABS: 20.0000 mg | ORAL_TABLET | Freq: Every day | ORAL | 2 refills | Status: DC

## 2017-12-08 MED ORDER — ALPRAZOLAM 0.5 MG PO TABS: 0.5000 mg | ORAL_TABLET | Freq: Two times a day (BID) | ORAL | 2 refills | Status: DC | PRN

## 2017-12-08 MED ORDER — GABAPENTIN 300 MG PO CAPS: 300.0000 mg | ORAL_CAPSULE | Freq: Three times a day (TID) | ORAL | 3 refills | Status: DC

## 2017-12-08 NOTE — Patient Instructions (Signed)
In a few days you may receive a survey in the mail or online from [employer] regarding your visit with us today. Please take a moment to fill this out. Your feedback is very important to our whole office. It can help us better understand your needs as well as improve your experience and satisfaction. Thank you for taking your time to complete it. We care about you.  Tariq Pernell, PA-C  

## 2017-12-08 NOTE — Progress Notes (Signed)
BP 113/84   Pulse 76   Temp 97.8 F (36.6 C) (Oral)   Ht 5\' 7"  (1.702 m)   Wt 154 lb (69.9 kg)   BMI 24.12 kg/m    Subjective:    Patient ID: Gabrielle [name], female    DOB: 1976-05-22, 42 y.o.   MRN: [id number]  HPI: Gabrielle [name] is a 42 y.o. female presenting on 12/08/2017 for Depression and Panic Attack  Patient comes in for recheck on her medications.  She has been off of her medication for many months due to not having insurance.  She does have health insurance now.  In the past she has been treated for depression and mood disorder also for chronic anxiety.  She also has a fair amount of myalgias and has been diagnosed with fibromyalgia.  She is working a physical job at this time at [employer]. [facility] 12/08/2017 11/12/2017 12/24/2016 12/14/2016 07/29/2016  Decreased Interest 1 0 3 0 3  Down, Depressed, Hopeless 1 1 3  0 2  PHQ - 2 Score 2 1 6  0 5  Altered sleeping 2 - 3 - 0  Tired, decreased energy 3 - 3 - 3  Change in appetite 1 - 3 - 3  Feeling bad or failure about yourself  1 - 3 - 2  Trouble concentrating 3 - 3 - 3  Moving slowly or fidgety/restless 2 - 3 - 2  Suicidal thoughts 0 - 0 - 0  PHQ-9 Score 14 - 24 - 18  Difficult doing work/chores - - Very difficult - -     Relevant past medical, surgical, family and social history reviewed and updated as indicated. Allergies and medications reviewed and updated.  Past Medical History:  Diagnosis Date  . Anxiety   . Fibromyalgia   . Fibromyalgia   . Hypertension     Past Surgical History:  Procedure Laterality Date  . ABDOMINAL HYSTERECTOMY    . APPENDECTOMY    . CHOLECYSTECTOMY      Review of Systems  Constitutional: Negative.   HENT: Negative.   Eyes: Negative.   Respiratory: Negative.   Gastrointestinal: Negative.   Genitourinary: Negative.   Psychiatric/Behavioral: Positive for decreased concentration and dysphoric mood. Negative for self-injury and suicidal ideas. The patient is  nervous/anxious.     Allergies as of 12/08/2017      Reactions   Morphine And Related    Percocet [oxycodone-acetaminophen] Other (See Comments)   Chest pain       Medication List        Accurate as of 12/08/17 10:38 AM. Always use your most recent med list.          acetaminophen 500 MG tablet Commonly known as:  TYLENOL Take 1,000-1,500 mg by mouth every 6 (six) hours as needed for headache.   ALPRAZolam 0.5 MG tablet Commonly known as:  XANAX Take 1 tablet (0.5 mg total) by mouth 2 (two) times daily as needed for anxiety.   citalopram 20 MG tablet Commonly known as:  CELEXA Take 1 tablet (20 mg total) by mouth daily.   gabapentin 300 MG capsule Commonly known as:  NEURONTIN Take 1 capsule (300 mg total) by mouth 3 (three) times daily.   lisinopril 40 MG tablet Commonly known as:  PRINIVIL,ZESTRIL Take 40 mg by mouth daily.   nitroGLYCERIN 0.4 MG SL tablet Commonly known as:  NITROSTAT Place 1 tablet (0.4 mg total) under the tongue every 5 (five) minutes as needed for chest  pain. After 3 doses if no relief, call 911.          Objective:    BP 113/84   Pulse 76   Temp 97.8 F (36.6 C) (Oral)   Ht 5\' 7"  (1.702 m)   Wt 154 lb (69.9 kg)   BMI 24.12 kg/m   Allergies  Allergen Reactions  . Morphine And Related   . Percocet [Oxycodone-Acetaminophen] Other (See Comments)    Chest pain     Physical Exam  Constitutional: She is oriented to person, place, and time. She appears well-developed and well-nourished.  HENT:  Head: Normocephalic and atraumatic.  Eyes: Conjunctivae and EOM are normal. Pupils are equal, round, and reactive to light.  Cardiovascular: Normal rate, regular rhythm, normal heart sounds and intact distal pulses.  Pulmonary/Chest: Effort normal and breath sounds normal.  Abdominal: Soft. Bowel sounds are normal.  Neurological: She is alert and oriented to person, place, and time. She has normal reflexes.  Skin: Skin is warm and dry. No rash  noted.  Psychiatric: Her behavior is normal. Judgment and thought content normal. Her mood appears anxious. Cognition and memory are normal. She exhibits a depressed mood.        Assessment & Plan:   1. Depression, recurrent (HCC) - citalopram (CELEXA) 20 MG tablet; Take 1 tablet (20 mg total) by mouth daily.  Dispense: 30 tablet; Refill: 2  2. Anxiety - ALPRAZolam (XANAX) 0.5 MG tablet; Take 1 tablet (0.5 mg total) by mouth 2 (two) times daily as needed for anxiety.  Dispense: 60 tablet; Refill: 2  3. Fibromyalgia - gabapentin (NEURONTIN) 300 MG capsule; Take 1 capsule (300 mg total) by mouth 3 (three) times daily.  Dispense: 60 capsule; Refill: 3    Current Outpatient Medications:  .  acetaminophen (TYLENOL) 500 MG tablet, Take 1,000-1,500 mg by mouth every 6 (six) hours as needed for headache., Disp: , Rfl:  .  ALPRAZolam (XANAX) 0.5 MG tablet, Take 1 tablet (0.5 mg total) by mouth 2 (two) times daily as needed for anxiety., Disp: 60 tablet, Rfl: 2 .  citalopram (CELEXA) 20 MG tablet, Take 1 tablet (20 mg total) by mouth daily., Disp: 30 tablet, Rfl: 2 .  gabapentin (NEURONTIN) 300 MG capsule, Take 1 capsule (300 mg total) by mouth 3 (three) times daily., Disp: 60 capsule, Rfl: 3 .  lisinopril (PRINIVIL,ZESTRIL) 40 MG tablet, Take 40 mg by mouth daily. , Disp: , Rfl:  .  nitroGLYCERIN (NITROSTAT) 0.4 MG SL tablet, Place 1 tablet (0.4 mg total) under the tongue every 5 (five) minutes as needed for chest pain. After 3 doses if no relief, call 911., Disp: 20 tablet, Rfl: 3 Continue all other maintenance medications as listed above.  Follow up plan: Return in about 4 weeks (around 01/05/2018) for recheck.  Educational handout given for survey  Remus Loffler PA-C [facility] [address]  [place], [place] [address] [phone]   12/08/2017, 10:38 AM

## 2017-12-29 ENCOUNTER — Other Ambulatory Visit: Payer: Self-pay | Admitting: Physician Assistant

## 2017-12-29 MED ORDER — AZITHROMYCIN 250 MG PO TABS: ORAL_TABLET | ORAL | 0 refills | Status: DC

## 2017-12-29 NOTE — Telephone Encounter (Signed)
zithromax sent to pharmacy

## 2017-12-29 NOTE — Telephone Encounter (Signed)
Patient aware.

## 2018-01-11 ENCOUNTER — Ambulatory Visit: Payer: BLUE CROSS/BLUE SHIELD | Admitting: Physician Assistant

## 2018-01-27 ENCOUNTER — Ambulatory Visit: Payer: BLUE CROSS/BLUE SHIELD | Admitting: Physician Assistant

## 2018-01-27 ENCOUNTER — Encounter: Payer: Self-pay | Admitting: Physician Assistant

## 2018-01-27 ENCOUNTER — Ambulatory Visit (INDEPENDENT_AMBULATORY_CARE_PROVIDER_SITE_OTHER): Payer: BLUE CROSS/BLUE SHIELD

## 2018-01-27 VITALS — BP 115/76 | HR 73 | Temp 97.5°F | Ht 67.0 in | Wt 150.0 lb

## 2018-01-27 DIAGNOSIS — S93401A Sprain of unspecified ligament of right ankle, initial encounter: Secondary | ICD-10-CM | POA: Diagnosis not present

## 2018-01-27 DIAGNOSIS — Z716 Tobacco abuse counseling: Secondary | ICD-10-CM

## 2018-01-27 DIAGNOSIS — S99911A Unspecified injury of right ankle, initial encounter: Secondary | ICD-10-CM

## 2018-01-27 MED ORDER — VARENICLINE TARTRATE 0.5 MG X 11 & 1 MG X 42 PO MISC: ORAL | 5 refills | Status: DC

## 2018-01-27 NOTE — Progress Notes (Signed)
BP 115/76   Pulse 73   Temp (!) 97.5 F (36.4 C) (Oral)   Ht 5\' 7"  (1.702 m)   Wt 150 lb (68 kg)   BMI 23.49 kg/m    Subjective:    Patient ID: Gabrielle [name], female    DOB: 10-03-1976, 42 y.o.   MRN: [id number]  HPI: Gabrielle [name] is a 42 y.o. female presenting on 01/27/2018 for Fall (right ankle pain )  1 day ago the patient was stepping off a porch and twisted her position.  The right ankle.  It is worse since with swelling.  She did hear a crunching in the ankle.  Also wants smoking cessation of Chantix.  Past Medical History:  Diagnosis Date  . Anxiety   . Fibromyalgia   . Fibromyalgia   . Hypertension    Relevant past medical, surgical, family and social history reviewed and updated as indicated. Interim medical history since our last visit reviewed. Allergies and medications reviewed and updated. DATA REVIEWED: CHART IN EPIC  Family History reviewed for pertinent findings.  Review of Systems  Constitutional: Negative.   HENT: Negative.   Eyes: Negative.   Respiratory: Negative.   Gastrointestinal: Negative.   Genitourinary: Negative.   Musculoskeletal: Positive for arthralgias, gait problem and joint swelling.    Allergies as of 01/27/2018      Reactions   Morphine And Related    Percocet [oxycodone-acetaminophen] Other (See Comments)   Chest pain       Medication List        Accurate as of 01/27/18  8:56 AM. Always use your most recent med list.          acetaminophen 500 MG tablet Commonly known as:  TYLENOL Take 1,000-1,500 mg by mouth every 6 (six) hours as needed for headache.   ALPRAZolam 0.5 MG tablet Commonly known as:  XANAX Take 1 tablet (0.5 mg total) by mouth 2 (two) times daily as needed for anxiety.   citalopram 20 MG tablet Commonly known as:  CELEXA Take 1 tablet (20 mg total) by mouth daily.   gabapentin 300 MG capsule Commonly known as:  NEURONTIN Take 1 capsule (300 mg total) by mouth 3 (three) times daily.     nitroGLYCERIN 0.4 MG SL tablet Commonly known as:  NITROSTAT Place 1 tablet (0.4 mg total) under the tongue every 5 (five) minutes as needed for chest pain. After 3 doses if no relief, call 911.   varenicline 0.5 MG X 11 & 1 MG X 42 tablet Commonly known as:  CHANTIX STARTING MONTH PAK Take one 0.5 mg tablet QD 3 days, then  0.5 mg tablet BID for 4 days, then 1 mg tablet twice daily.          Objective:    BP 115/76   Pulse 73   Temp (!) 97.5 F (36.4 C) (Oral)   Ht 5\' 7"  (1.702 m)   Wt 150 lb (68 kg)   BMI 23.49 kg/m   Allergies  Allergen Reactions  . Morphine And Related   . Percocet [Oxycodone-Acetaminophen] Other (See Comments)    Chest pain     Wt Readings from Last 3 Encounters:  01/27/18 150 lb (68 kg)  12/08/17 154 lb (69.9 kg)  11/12/17 149 lb (67.6 kg)    Physical Exam  Constitutional: She is oriented to person, place, and time. She appears well-developed and well-nourished.  HENT:  Head: Normocephalic and atraumatic.  Eyes: Conjunctivae and EOM are normal. Pupils  are equal, round, and reactive to light.  Cardiovascular: Normal rate, regular rhythm, normal heart sounds and intact distal pulses.  Pulmonary/Chest: Effort normal and breath sounds normal.  Abdominal: Soft. Bowel sounds are normal.  Musculoskeletal:       Right ankle: She exhibits swelling and deformity. Tenderness. Lateral malleolus tenderness found. Achilles tendon normal.       Feet:  Neurological: She is alert and oriented to person, place, and time. She has normal reflexes.  Skin: Skin is warm and dry. No rash noted.  Psychiatric: She has a normal mood and affect. Her behavior is normal. Judgment and thought content normal.        Assessment & Plan:   1. Injury of right ankle, initial encounter - DG Ankle Complete Right; Future  2. Encounter for smoking cessation counseling - varenicline (CHANTIX STARTING MONTH PAK) 0.5 MG X 11 & 1 MG X 42 tablet; Take one 0.5 mg tablet QD 3  days, then  0.5 mg tablet BID for 4 days, then 1 mg tablet twice daily.  Dispense: 53 tablet; Refill: 5  3. Sprain of right ankle, unspecified ligament, initial encounter   Continue all other maintenance medications as listed above.  Follow up plan: Return if symptoms worsen or fail to improve.  Educational handout given for survey  Remus Loffler PA-C [facility] [address]  [place], [place] [address] [phone]   01/27/2018, 8:56 AM

## 2018-01-27 NOTE — Patient Instructions (Signed)
In a few days you may receive a survey in the mail or online from [employer] regarding your visit with us today. Please take a moment to fill this out. Your feedback is very important to our whole office. It can help us better understand your needs as well as improve your experience and satisfaction. Thank you for taking your time to complete it. We care about you.  Ashwika Freels, PA-C  

## 2018-02-11 ENCOUNTER — Ambulatory Visit: Payer: BLUE CROSS/BLUE SHIELD | Admitting: Physician Assistant

## 2018-02-11 ENCOUNTER — Encounter: Payer: Self-pay | Admitting: Physician Assistant

## 2018-02-11 VITALS — BP 107/78 | HR 92 | Temp 98.1°F | Ht 67.0 in | Wt 152.0 lb

## 2018-02-11 DIAGNOSIS — M25552 Pain in left hip: Secondary | ICD-10-CM

## 2018-02-11 DIAGNOSIS — J301 Allergic rhinitis due to pollen: Secondary | ICD-10-CM | POA: Diagnosis not present

## 2018-02-11 DIAGNOSIS — J0110 Acute frontal sinusitis, unspecified: Secondary | ICD-10-CM

## 2018-02-11 DIAGNOSIS — G43809 Other migraine, not intractable, without status migrainosus: Secondary | ICD-10-CM | POA: Diagnosis not present

## 2018-02-11 MED ORDER — KETOROLAC TROMETHAMINE 60 MG/2ML IM SOLN
60.0000 mg | Freq: Once | INTRAMUSCULAR | Status: AC
  Administered 2018-02-11: 60 mg via INTRAMUSCULAR

## 2018-02-11 MED ORDER — METHYLPREDNISOLONE ACETATE 80 MG/ML IJ SUSP
80.0000 mg | Freq: Once | INTRAMUSCULAR | Status: AC
  Administered 2018-02-11: 80 mg via INTRAMUSCULAR

## 2018-02-11 MED ORDER — LORATADINE 10 MG PO TABS: 10.0000 mg | ORAL_TABLET | Freq: Every day | ORAL | 11 refills | Status: DC

## 2018-02-11 MED ORDER — TOPIRAMATE 50 MG PO TABS: 50.0000 mg | ORAL_TABLET | Freq: Two times a day (BID) | ORAL | 2 refills | Status: DC

## 2018-02-11 MED ORDER — AMOXICILLIN 500 MG PO CAPS: 1000.0000 mg | ORAL_CAPSULE | Freq: Two times a day (BID) | ORAL | 0 refills | Status: DC

## 2018-02-11 NOTE — Patient Instructions (Signed)
In a few days you may receive a survey in the mail or online from [employer] regarding your visit with us today. Please take a moment to fill this out. Your feedback is very important to our whole office. It can help us better understand your needs as well as improve your experience and satisfaction. Thank you for taking your time to complete it. We care about you.  Amato Sevillano, PA-C  

## 2018-02-11 NOTE — Progress Notes (Signed)
BP 107/78   Pulse 92   Temp 98.1 F (36.7 C) (Oral)   Ht 5\' 7"  (1.702 m)   Wt 152 lb (68.9 kg)   BMI 23.81 kg/m     Subjective:    Patient ID: Gabrielle [name], female    DOB: [birth year], 42 y.o.   MRN: [id number]  HPI: Gabrielle [name] is a 42 y.o. female presenting on 02/11/2018 for Hip Pain; Leg Pain; chest congestion; and Headache  This patient has had many days of sinus headache and postnasal drainage. There is copious drainage at times. Denies any fever at this time. There has been a history of sinus infections in the past.  No history of sinus surgery. There is cough at night. It has become more prevalent in recent days. Patient also has significant headaches that are daily.  She has had problems in the past with migraines.  She also has seasonal allergies.  She is currently not taking any allergy medicine.  She is also having significant pain from her hips down to the back of her knees.  It feels very tight at times.  She thought she even had the flu.  Past Medical History:  Diagnosis Date  . Anxiety   . Fibromyalgia   . Fibromyalgia   . Hypertension    Relevant past medical, surgical, family and social history reviewed and updated as indicated. Interim medical history since our last visit reviewed. Allergies and medications reviewed and updated. DATA REVIEWED: CHART IN EPIC  Family History reviewed for pertinent findings.  Review of Systems  Constitutional: Positive for chills and fatigue. Negative for activity change, appetite change and fever.  HENT: Positive for congestion, postnasal drip and sore throat.   Eyes: Negative.   Respiratory: Positive for cough and wheezing.   Cardiovascular: Negative.  Negative for chest pain, palpitations and leg swelling.  Gastrointestinal: Negative.   Genitourinary: Negative.   Musculoskeletal: Positive for arthralgias and myalgias.  Skin: Negative.   Neurological: Positive for headaches.    Allergies as of 02/11/2018    Reactions   Morphine And Related    Percocet [oxycodone-acetaminophen] Other (See Comments)   Chest pain       Medication List        Accurate as of 02/11/18  9:15 AM. Always use your most recent med list.          acetaminophen 500 MG tablet Commonly known as:  TYLENOL Take 1,000-1,500 mg by mouth every 6 (six) hours as needed for headache.   ALPRAZolam 0.5 MG tablet Commonly known as:  XANAX Take 1 tablet (0.5 mg total) by mouth 2 (two) times daily as needed for anxiety.   amoxicillin 500 MG capsule Commonly known as:  AMOXIL Take 2 capsules (1,000 mg total) by mouth 2 (two) times daily.   citalopram 20 MG tablet Commonly known as:  CELEXA Take 1 tablet (20 mg total) by mouth daily.   gabapentin 300 MG capsule Commonly known as:  NEURONTIN Take 1 capsule (300 mg total) by mouth 3 (three) times daily.   loratadine 10 MG tablet Commonly known as:  CLARITIN Take 1 tablet (10 mg total) by mouth daily.   nitroGLYCERIN 0.4 MG SL tablet Commonly known as:  NITROSTAT Place 1 tablet (0.4 mg total) under the tongue every 5 (five) minutes as needed for chest pain. After 3 doses if no relief, call 911.   topiramate 50 MG tablet Commonly known as:  TOPAMAX Take 1 tablet (50 mg total) by  mouth 2 (two) times daily. Migraine prevention   varenicline 0.5 MG X 11 & 1 MG X 42 tablet Commonly known as:  CHANTIX STARTING MONTH PAK Take one 0.5 mg tablet QD 3 days, then  0.5 mg tablet BID for 4 days, then 1 mg tablet twice daily.          Objective:    BP 107/78   Pulse 92   Temp 98.1 F (36.7 C) (Oral)   Ht 5\' 7"  (1.702 m)   Wt 152 lb (68.9 kg)   BMI 23.81 kg/m    Allergies  Allergen Reactions  . Morphine And Related   . Percocet [Oxycodone-Acetaminophen] Other (See Comments)    Chest pain     Wt Readings from Last 3 Encounters:  02/11/18 152 lb (68.9 kg)  01/27/18 150 lb (68 kg)  12/08/17 154 lb (69.9 kg)    Physical Exam  Constitutional: She is oriented to  person, place, and time. She appears well-developed and well-nourished.  HENT:  Head: Normocephalic and atraumatic.  Right Ear: There is drainage and tenderness.  Left Ear: There is drainage and tenderness.  Nose: Mucosal edema and rhinorrhea present. Right sinus exhibits no maxillary sinus tenderness and no frontal sinus tenderness. Left sinus exhibits no maxillary sinus tenderness and no frontal sinus tenderness.  Mouth/Throat: Oropharyngeal exudate and posterior oropharyngeal erythema present.  Eyes: Conjunctivae and EOM are normal. Pupils are equal, round, and reactive to light.  Neck: Normal range of motion. Neck supple.  Cardiovascular: Normal rate, regular rhythm, normal heart sounds and intact distal pulses.  Pulmonary/Chest: Effort normal. She has wheezes in the right upper field and the left upper field.  Abdominal: Soft. Bowel sounds are normal.  Neurological: She is alert and oriented to person, place, and time. She has normal reflexes.  Skin: Skin is warm and dry. No rash noted.  Psychiatric: She has a normal mood and affect. Her behavior is normal. Judgment and thought content normal.  Nursing note and vitals reviewed.       Assessment & Plan:   1. Acute non-recurrent frontal sinusitis - amoxicillin (AMOXIL) 500 MG capsule; Take 2 capsules (1,000 mg total) by mouth 2 (two) times daily.  Dispense: 40 capsule; Refill: 0 - methylPREDNISolone acetate (DEPO-MEDROL) injection 80 mg  2. Non-seasonal allergic rhinitis due to pollen - loratadine (CLARITIN) 10 MG tablet; Take 1 tablet (10 mg total) by mouth daily.  Dispense: 30 tablet; Refill: 11  3. Pain of left hip joint - ketorolac (TORADOL) injection 60 mg  4. Other migraine without status migrainosus, not intractable - topiramate (TOPAMAX) 50 MG tablet; Take 1 tablet (50 mg total) by mouth 2 (two) times daily. Migraine prevention  Dispense: 60 tablet; Refill: 2   Continue all other maintenance medications as listed  above.  Follow up plan: Return in about 3 months (around 05/14/2018) for recheck.  Educational handout given for survey  Remus Loffler PA-C [facility] [address]  [place], [place] [address] [phone]   02/11/2018, 9:15 AM

## 2018-03-04 ENCOUNTER — Other Ambulatory Visit: Payer: Self-pay | Admitting: Physician Assistant

## 2018-03-04 ENCOUNTER — Telehealth: Payer: Self-pay | Admitting: Physician Assistant

## 2018-03-04 DIAGNOSIS — F419 Anxiety disorder, unspecified: Secondary | ICD-10-CM

## 2018-03-04 MED ORDER — ALPRAZOLAM 0.5 MG PO TABS: 0.5000 mg | ORAL_TABLET | Freq: Two times a day (BID) | ORAL | 2 refills | Status: DC | PRN

## 2018-03-04 NOTE — Telephone Encounter (Signed)
Patient aware.

## 2018-03-04 NOTE — Telephone Encounter (Signed)
Pharmacy aware and patient aware

## 2018-03-04 NOTE — Telephone Encounter (Signed)
Needs nurse to call and approve because xanax is due until the 03/07/18.[facility]. Please advise

## 2018-03-04 NOTE — Telephone Encounter (Signed)
Sent a refill into th pharmacy, sorry about your loss.

## 2018-03-25 ENCOUNTER — Ambulatory Visit: Payer: BLUE CROSS/BLUE SHIELD | Admitting: Physician Assistant

## 2018-03-25 ENCOUNTER — Other Ambulatory Visit: Payer: Self-pay | Admitting: Physician Assistant

## 2018-03-25 ENCOUNTER — Encounter: Payer: Self-pay | Admitting: Physician Assistant

## 2018-03-25 VITALS — BP 150/90 | HR 84 | Temp 98.0°F | Ht 67.0 in | Wt 146.2 lb

## 2018-03-25 DIAGNOSIS — F339 Major depressive disorder, recurrent, unspecified: Secondary | ICD-10-CM | POA: Diagnosis not present

## 2018-03-25 DIAGNOSIS — F4321 Adjustment disorder with depressed mood: Secondary | ICD-10-CM

## 2018-03-25 DIAGNOSIS — M797 Fibromyalgia: Secondary | ICD-10-CM

## 2018-03-25 DIAGNOSIS — F4320 Adjustment disorder, unspecified: Secondary | ICD-10-CM

## 2018-03-25 DIAGNOSIS — A064 Amebic liver abscess: Secondary | ICD-10-CM

## 2018-03-25 DIAGNOSIS — A069 Amebiasis, unspecified: Secondary | ICD-10-CM | POA: Diagnosis not present

## 2018-03-25 MED ORDER — CLINDAMYCIN HCL 300 MG PO CAPS: 300.0000 mg | ORAL_CAPSULE | Freq: Three times a day (TID) | ORAL | 3 refills | Status: DC

## 2018-03-25 MED ORDER — ARIPIPRAZOLE 10 MG PO TBDP: 10.0000 mg | ORAL_TABLET | Freq: Every day | ORAL | 5 refills | Status: DC

## 2018-03-25 MED ORDER — CITALOPRAM HYDROBROMIDE 40 MG PO TABS: 40.0000 mg | ORAL_TABLET | Freq: Every day | ORAL | 5 refills | Status: DC

## 2018-03-25 MED ORDER — GABAPENTIN 400 MG PO CAPS: 400.0000 mg | ORAL_CAPSULE | Freq: Three times a day (TID) | ORAL | 5 refills | Status: DC

## 2018-03-25 NOTE — Progress Notes (Signed)
BP (!) 150/90   Pulse 84   Temp 98 F (36.7 C) (Oral)   Ht 5\' 7"  (1.702 m)   Wt 146 lb 3.2 oz (66.3 kg)   BMI 22.90 kg/m    Subjective:    Patient ID: Gabrielle [name], female    DOB: [birth year], 42 y.o.   MRN: [id number]  HPI: Gabrielle [name] is a 42 y.o. female presenting on 03/25/2018 for Depression  Patient comes in for severe depression related to the loss of her father and then trouble with family creating a lot of drama. She is not able to fix the problem. Still with the grief and work issues.  [facility] 03/25/2018 02/11/2018 01/27/2018 12/08/2017 11/12/2017  Decreased Interest 3 0 0 1 0  Down, Depressed, Hopeless 1 0 0 1 1  PHQ - 2 Score 4 0 0 2 1  Altered sleeping 3 - - 2 -  Tired, decreased energy 3 - - 3 -  Change in appetite 2 - - 1 -  Feeling bad or failure about yourself  0 - - 1 -  Trouble concentrating 3 - - 3 -  Moving slowly or fidgety/restless 3 - - 2 -  Suicidal thoughts 0 - - 0 -  PHQ-9 Score 18 - - 14 -  Difficult doing work/chores - - - - -     Past Medical History:  Diagnosis Date  . Anxiety   . Fibromyalgia   . Fibromyalgia   . Hypertension    Relevant past medical, surgical, family and social history reviewed and updated as indicated. Interim medical history since our last visit reviewed. Allergies and medications reviewed and updated. DATA REVIEWED: CHART IN EPIC  Family History reviewed for pertinent findings.  Review of Systems  Constitutional: Negative.   HENT: Negative.   Eyes: Negative.   Respiratory: Negative.   Gastrointestinal: Negative.   Genitourinary: Negative.   Psychiatric/Behavioral: Positive for decreased concentration, dysphoric mood and sleep disturbance. Negative for self-injury and suicidal ideas. The patient is nervous/anxious.     Allergies as of 03/25/2018      Reactions   Morphine And Related    Percocet [oxycodone-acetaminophen] Other (See Comments)   Chest pain       Medication List        Accurate as of 03/25/18  1:34 PM. Always use your most recent med list.          acetaminophen 500 MG tablet Commonly known as:  TYLENOL Take 1,000-1,500 mg by mouth every 6 (six) hours as needed for headache.   ALPRAZolam 0.5 MG tablet Commonly known as:  XANAX Take 1 tablet (0.5 mg total) by mouth 2 (two) times daily as needed for anxiety.   aripiprazole 10 MG disintegrating tablet Commonly known as:  ABILIFY Take 1 tablet (10 mg total) by mouth daily.   citalopram 40 MG tablet Commonly known as:  CELEXA Take 1 tablet (40 mg total) by mouth daily.   clindamycin 300 MG capsule Commonly known as:  CLEOCIN Take 1 capsule (300 mg total) by mouth 3 (three) times daily.   gabapentin 400 MG capsule Commonly known as:  NEURONTIN Take 1 capsule (400 mg total) by mouth 3 (three) times daily.   loratadine 10 MG tablet Commonly known as:  CLARITIN Take 1 tablet (10 mg total) by mouth daily.   nitroGLYCERIN 0.4 MG SL tablet Commonly known as:  NITROSTAT Place 1 tablet (0.4 mg total) under the tongue every 5 (five) minutes  as needed for chest pain. After 3 doses if no relief, call 911.   topiramate 50 MG tablet Commonly known as:  TOPAMAX Take 1 tablet (50 mg total) by mouth 2 (two) times daily. Migraine prevention   varenicline 0.5 MG X 11 & 1 MG X 42 tablet Commonly known as:  CHANTIX STARTING MONTH PAK Take one 0.5 mg tablet QD 3 days, then  0.5 mg tablet BID for 4 days, then 1 mg tablet twice daily.          Objective:    BP (!) 150/90   Pulse 84   Temp 98 F (36.7 C) (Oral)   Ht 5\' 7"  (1.702 m)   Wt 146 lb 3.2 oz (66.3 kg)   BMI 22.90 kg/m   Allergies  Allergen Reactions  . Morphine And Related   . Percocet [Oxycodone-Acetaminophen] Other (See Comments)    Chest pain     Wt Readings from Last 3 Encounters:  03/25/18 146 lb 3.2 oz (66.3 kg)  02/11/18 152 lb (68.9 kg)  01/27/18 150 lb (68 kg)    Physical Exam  Constitutional: She is oriented to person,  place, and time. She appears well-developed and well-nourished.  HENT:  Head: Normocephalic and atraumatic.  Eyes: Pupils are equal, round, and reactive to light. Conjunctivae and EOM are normal.  Cardiovascular: Normal rate, regular rhythm, normal heart sounds and intact distal pulses.  Pulmonary/Chest: Effort normal and breath sounds normal.  Abdominal: Soft. Bowel sounds are normal.  Neurological: She is alert and oriented to person, place, and time. She has normal reflexes.  Skin: Skin is warm and dry. No rash noted.  Psychiatric: She has a normal mood and affect. Her behavior is normal. Judgment and thought content normal.    Results for orders placed or performed in visit on 11/12/17  Veritor Flu A/B Waived  Result Value Ref Range   Influenza A Negative Negative   Influenza B Negative Negative      Assessment & Plan:   1. Depression, recurrent (HCC) - citalopram (CELEXA) 40 MG tablet; Take 1 tablet (40 mg total) by mouth daily.  Dispense: 30 tablet; Refill: 5 - aripiprazole (ABILIFY) 10 MG disintegrating tablet; Take 1 tablet (10 mg total) by mouth daily.  Dispense: 30 tablet; Refill: 5  2. Grief reaction  3. Fibromyalgia - gabapentin (NEURONTIN) 400 MG capsule; Take 1 capsule (400 mg total) by mouth 3 (three) times daily.  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  Dispense: 30 capsule; Refill: 3   Continue all other maintenance medications as listed above.  Follow up plan: Return in about 1 month (around 04/22/2018) for recheck.  Educational handout given for survey  Remus Loffler PA-C [facility] [address]  [place], [place] [address] [phone]   03/25/2018, 1:34 PM

## 2018-03-28 ENCOUNTER — Ambulatory Visit (INDEPENDENT_AMBULATORY_CARE_PROVIDER_SITE_OTHER): Payer: BLUE CROSS/BLUE SHIELD | Admitting: *Deleted

## 2018-03-28 DIAGNOSIS — Z111 Encounter for screening for respiratory tuberculosis: Secondary | ICD-10-CM | POA: Diagnosis not present

## 2018-03-28 DIAGNOSIS — Z23 Encounter for immunization: Secondary | ICD-10-CM

## 2018-03-28 NOTE — Progress Notes (Signed)
PPD placed L forearm Pt tolerated well 

## 2018-03-30 LAB — TB SKIN TEST
Induration: 0 mm
TB SKIN TEST: NEGATIVE

## 2018-04-14 ENCOUNTER — Emergency Department (HOSPITAL_COMMUNITY): Payer: BLUE CROSS/BLUE SHIELD

## 2018-04-14 ENCOUNTER — Emergency Department (HOSPITAL_COMMUNITY)
Admission: EM | Admit: 2018-04-14 | Discharge: 2018-04-15 | Disposition: A | Discharge status: Home / Self Care | Payer: BLUE CROSS/BLUE SHIELD | Attending: Emergency Medicine | Admitting: Emergency Medicine

## 2018-04-14 ENCOUNTER — Encounter (HOSPITAL_COMMUNITY): Payer: Self-pay | Admitting: *Deleted

## 2018-04-14 ENCOUNTER — Other Ambulatory Visit: Payer: Self-pay

## 2018-04-14 DIAGNOSIS — Z79899 Other long term (current) drug therapy: Secondary | ICD-10-CM | POA: Diagnosis not present

## 2018-04-14 DIAGNOSIS — I10 Essential (primary) hypertension: Secondary | ICD-10-CM | POA: Diagnosis not present

## 2018-04-14 DIAGNOSIS — I82622 Acute embolism and thrombosis of deep veins of left upper extremity: Secondary | ICD-10-CM

## 2018-04-14 DIAGNOSIS — F17200 Nicotine dependence, unspecified, uncomplicated: Secondary | ICD-10-CM | POA: Diagnosis not present

## 2018-04-14 DIAGNOSIS — R079 Chest pain, unspecified: Secondary | ICD-10-CM | POA: Diagnosis present

## 2018-04-14 LAB — CBC
HCT: 42.2 % (ref 36.0–46.0)
Hemoglobin: 14.3 g/dL (ref 12.0–15.0)
MCH: 29 pg (ref 26.0–34.0)
MCHC: 33.9 g/dL (ref 30.0–36.0)
MCV: 85.6 fL (ref 78.0–100.0)
PLATELETS: 255 10*3/uL (ref 150–400)
RBC: 4.93 MIL/uL (ref 3.87–5.11)
RDW: 13.5 % (ref 11.5–15.5)
WBC: 17 10*3/uL — AB (ref 4.0–10.5)

## 2018-04-14 LAB — URINALYSIS, ROUTINE W REFLEX MICROSCOPIC
BILIRUBIN URINE: NEGATIVE
Glucose, UA: NEGATIVE mg/dL
Hgb urine dipstick: NEGATIVE
Ketones, ur: NEGATIVE mg/dL
Leukocytes, UA: NEGATIVE
NITRITE: NEGATIVE
PH: 5 (ref 5.0–8.0)
Protein, ur: NEGATIVE mg/dL
Specific Gravity, Urine: 1.02 (ref 1.005–1.030)

## 2018-04-14 LAB — BASIC METABOLIC PANEL
ANION GAP: 9 (ref 5–15)
BUN: 18 mg/dL (ref 6–20)
CALCIUM: 9.3 mg/dL (ref 8.9–10.3)
CO2: 24 mmol/L (ref 22–32)
CREATININE: 0.69 mg/dL (ref 0.44–1.00)
Chloride: 104 mmol/L (ref 101–111)
GFR calc Af Amer: >60 mL/min (ref >60)
Glucose, Bld: 95 mg/dL (ref 65–99)
POTASSIUM: 4.3 mmol/L (ref 3.5–5.1)
SODIUM: 137 mmol/L (ref 135–145)

## 2018-04-14 LAB — TROPONIN I: Troponin I: <0.03 ng/mL (ref <0.03)

## 2018-04-14 LAB — D-DIMER, QUANTITATIVE (NOT AT ARMC): D DIMER QUANT: 0.82 ug{FEU}/mL — AB (ref 0.00–0.50)

## 2018-04-14 MED ORDER — NITROGLYCERIN 0.4 MG SL SUBL
0.4000 mg | SUBLINGUAL_TABLET | SUBLINGUAL | Status: DC | PRN
  Administered 2018-04-14: 0.4 mg via SUBLINGUAL
  Filled 2018-04-14: qty 1

## 2018-04-14 MED ORDER — FENTANYL CITRATE (PF) 100 MCG/2ML IJ SOLN
50.0000 ug | Freq: Once | INTRAMUSCULAR | Status: AC
  Administered 2018-04-14: 50 ug via INTRAVENOUS
  Filled 2018-04-14: qty 2

## 2018-04-14 MED ORDER — IOPAMIDOL (ISOVUE-370) INJECTION 76%
100.0000 mL | Freq: Once | INTRAVENOUS | Status: AC | PRN
  Administered 2018-04-14: 100 mL via INTRAVENOUS

## 2018-04-14 MED ORDER — LORAZEPAM 2 MG/ML IJ SOLN
0.5000 mg | Freq: Once | INTRAMUSCULAR | Status: AC
  Administered 2018-04-14: 0.5 mg via INTRAVENOUS
  Filled 2018-04-14: qty 1

## 2018-04-14 NOTE — ED Triage Notes (Addendum)
Pt reports chest pain since this morning. Pt states she took 2 aspirin and fell asleep until 2 pm. Pt states she woke up with severe left arm pain and left leg pain. Pt reports she was told to come to the ED by her PCP.

## 2018-04-15 ENCOUNTER — Ambulatory Visit (HOSPITAL_COMMUNITY)
Admission: RE | Admit: 2018-04-15 | Discharge: 2018-04-15 | Disposition: A | Discharge status: Home / Self Care | Payer: BLUE CROSS/BLUE SHIELD | Source: Ambulatory Visit | Attending: Emergency Medicine | Admitting: Emergency Medicine

## 2018-04-15 DIAGNOSIS — I82622 Acute embolism and thrombosis of deep veins of left upper extremity: Secondary | ICD-10-CM

## 2018-04-15 LAB — TROPONIN I

## 2018-04-15 MED ORDER — HYDROCODONE-ACETAMINOPHEN 5-325 MG PO TABS: 2.0000 | ORAL_TABLET | ORAL | 0 refills | Status: DC | PRN

## 2018-04-15 MED ORDER — KETOROLAC TROMETHAMINE 30 MG/ML IJ SOLN
30.0000 mg | Freq: Once | INTRAMUSCULAR | Status: AC
  Administered 2018-04-15: 30 mg via INTRAVENOUS
  Filled 2018-04-15: qty 1

## 2018-04-15 NOTE — ED Provider Notes (Signed)
Care assumed from Dr. Clayborne Dana.  Patient awaiting second troponin for atypical chest pain.  CT PE study negative. Patient with several hours of ongoing left arm pain without evidence of cervical radiculopathy. Intact grip strength and radial pulse.  Troponin negative x2. Follow up with PCP. D/w patient that she is low risk for chest pain but not zero risk.  Patient aware of ultrasound study tomorrow.  She will follow-up with PCP.  Return precautions discussed.   Glynn Octave, MD 04/15/18 [phone]

## 2018-04-15 NOTE — ED Notes (Signed)
Patient was given a prepackage of hydrocodone-acetaminophen and given instructions on use, patient verbally understands instructions.

## 2018-04-15 NOTE — Discharge Instructions (Addendum)
Your testing is negative for heart attack or blood clot in the lung. Followup tomorrow for an ultrasound of your arm.

## 2018-04-15 NOTE — ED Provider Notes (Signed)
[facility] EMERGENCY DEPARTMENT Provider Note   CSN: [id number] Arrival date & time: 04/14/18  2001     History   Chief Complaint Chief Complaint  Patient presents with  . Chest Pain    HPI Gabrielle [name] is a 42 y.o. female.  Woke up this morning some left-sided chest pain.  She has a smoking history and her father that heart attack at a young age so she took a couple aspirin.  Chest pain got better she went to sleep.  She woke up this evening with arm pain.  Arm pain is consistent but seems to get worse and she will have chest pain when it gets worse as well. No fever or cough.   Chest Pain   This is a new problem. The current episode started 3 to 5 hours ago. The problem occurs constantly. The problem has not changed since onset.The pain is present in the lateral region. The pain is mild. The quality of the pain is described as brief and sharp. The pain radiates to the left arm. She has tried nothing for the symptoms. The treatment provided no relief.    Past Medical History:  Diagnosis Date  . Anxiety   . Fibromyalgia   . Fibromyalgia   . Hypertension     Patient Active Problem List   Diagnosis Date Noted  . Grief reaction 03/25/2018  . Depression, recurrent (HCC) 12/08/2017  . Anxiety 12/08/2017  . Generalized anxiety disorder 07/29/2016  . Fibromyalgia 07/29/2016  . Essential hypertension 07/29/2016  . Tobacco use disorder 07/29/2016  . Bilateral low back pain without sciatica 09/12/2014    Past Surgical History:  Procedure Laterality Date  . ABDOMINAL HYSTERECTOMY    . APPENDECTOMY    . CHOLECYSTECTOMY       OB History   None      Home Medications    Prior to Admission medications   Medication Sig Start Date End Date Taking? Authorizing Provider  acetaminophen (TYLENOL) 500 MG tablet Take 1,000-1,500 mg by mouth every 6 (six) hours as needed for headache.    [provider]  ALPRAZolam Prudy Feeler) 0.5 MG tablet Take 1 tablet (0.5 mg total)  by mouth 2 (two) times daily as needed for anxiety. 03/04/18   Remus Loffler, PA-C  aripiprazole (ABILIFY) 10 MG disintegrating tablet Take 1 tablet (10 mg total) by mouth daily. 03/25/18   Remus Loffler, PA-C  citalopram (CELEXA) 40 MG tablet Take 1 tablet (40 mg total) by mouth daily. 03/25/18   Remus Loffler, PA-C  gabapentin (NEURONTIN) 400 MG capsule Take 1 capsule (400 mg total) by mouth 3 (three) times daily. 03/25/18   Remus Loffler, PA-C  HYDROcodone-acetaminophen (NORCO/VICODIN) 5-325 MG tablet Take 2 tablets by mouth every 4 (four) hours as needed. 04/15/18   Maybell Misenheimer, Barbara Cower, MD  loratadine (CLARITIN) 10 MG tablet Take 1 tablet (10 mg total) by mouth daily. 02/11/18   Remus Loffler, PA-C    Family History Family History  Problem Relation Age of Onset  . Fibromyalgia Mother   . Stroke Mother   . Osteoporosis Mother   . Cancer Father   . Heart disease Father     Social History Social History   Tobacco Use  . Smoking status: Current Every Day Smoker    Packs/day: 2.00    Years: 28.00    Pack years: 56.00  . Smokeless tobacco: Never Used  Substance Use Topics  . Alcohol use: No  . Drug use:  Yes    Types: Marijuana     Allergies   Morphine and related and Percocet [oxycodone-acetaminophen]   Review of Systems Review of Systems  Cardiovascular: Positive for chest pain.  All other systems reviewed and are negative.    Physical Exam Updated Vital Signs BP 107/68 (BP Location: Right Arm)   Pulse 70   Temp 98.3 F (36.8 C) (Oral)   Resp 20   Ht  (1.702 m)   Wt 66.2 kg (146 lb)   SpO2 100%   BMI 22.87 kg/m   Physical Exam  Constitutional: She is oriented to person, place, and time. She appears well-developed and well-nourished.  HENT:  Head: Normocephalic and atraumatic.  Eyes: Pupils are equal, round, and reactive to light.  Neck: Normal range of motion.  Cardiovascular: Normal rate, regular rhythm and intact distal pulses.  Pulmonary/Chest: Effort  normal and breath sounds normal. No stridor. No respiratory distress. She has no decreased breath sounds. She has no wheezes.  Abdominal: Soft. She exhibits no distension.  Musculoskeletal: Normal range of motion. She exhibits no edema or deformity.  Neurological: She is alert and oriented to person, place, and time.  Skin: Skin is warm and dry.  Nursing note and vitals reviewed.    ED Treatments / Results  Labs (all labs ordered are listed, but only abnormal results are displayed) Labs Reviewed  CBC - Abnormal; Notable for the following components:      Result Value   WBC 17.0 (*)    All other components within normal limits  D-DIMER, QUANTITATIVE (NOT AT [facility]) - Abnormal; Notable for the following components:   D-Dimer, Quant 0.82 (*)    All other components within normal limits  BASIC METABOLIC PANEL  TROPONIN I  URINALYSIS, ROUTINE W REFLEX MICROSCOPIC  TROPONIN I    EKG EKG Interpretation  Date/Time:  Thursday Apr 14 2018 20:05:40 EDT Ventricular Rate:  86 PR Interval:  120 QRS Duration: 88 QT Interval:  358 QTC Calculation: 428 R Axis:   97 Text Interpretation:  Normal sinus rhythm Right atrial enlargement Rightward axis Borderline ECG No significant change since last tracing Confirmed by Marily Memos [phone]) on 04/14/2018 9:13:37 PM   Radiology Dg Chest 2 View  Result Date: 04/14/2018 CLINICAL DATA:  Chest pain for several hours EXAM: CHEST - 2 VIEW COMPARISON:  03/24/2017 FINDINGS: The heart size and mediastinal contours are within normal limits. Both lungs are clear. The visualized skeletal structures are unremarkable. IMPRESSION: No active cardiopulmonary disease. Electronically Signed   By: Alcide Clever M.D.   On: 04/14/2018 20:38   Ct Angio Chest Pe W And/or Wo Contrast  Result Date: 04/14/2018 CLINICAL DATA:  Acute onset of severe left arm pain and left leg pain. Generalized chest pain. EXAM: CT ANGIOGRAPHY CHEST WITH CONTRAST TECHNIQUE: Multidetector CT  imaging of the chest was performed using the standard protocol during bolus administration of intravenous contrast. Multiplanar CT image reconstructions and MIPs were obtained to evaluate the vascular anatomy. CONTRAST:  ISOVUE-370 IOPAMIDOL (ISOVUE-370) INJECTION 76% COMPARISON:  Chest radiograph performed earlier today at 8:23 p.m., and CTA of the chest performed 07/25/2014 FINDINGS: Cardiovascular:  There is no evidence of pulmonary embolus. The heart is normal in size. Mild calcification and mural thrombus is noted along the aortic arch. The great vessels are grossly unremarkable. Mediastinum/Nodes: The mediastinum is unremarkable in appearance. No mediastinal lymphadenopathy is seen. No pericardial effusion is identified. The visualized portions of the thyroid gland are unremarkable. No axillary lymphadenopathy  is seen. Lungs/Pleura: The lungs are clear bilaterally. No focal consolidation, pleural effusion or pneumothorax is seen. No masses are identified. Upper Abdomen: The visualized portions of the liver and spleen are unremarkable. Musculoskeletal: No acute osseous abnormalities are identified. The visualized musculature is unremarkable in appearance. Review of the MIP images confirms the above findings. IMPRESSION: 1. No evidence of pulmonary embolus. 2. Lungs clear bilaterally. Electronically Signed   By: Roanna Raider M.D.   On: 04/14/2018 23:20    Procedures Procedures (including critical care time)  Medications Ordered in ED Medications  nitroGLYCERIN (NITROSTAT) SL tablet 0.4 mg (0.4 mg Sublingual Given 04/14/18 2127)  ketorolac (TORADOL) 30 MG/ML injection 30 mg (has no administration in time range)  fentaNYL (SUBLIMAZE) injection 50 mcg (50 mcg Intravenous Given 04/14/18 2221)  iopamidol (ISOVUE-370) 76 % injection 100 mL (100 mLs Intravenous Contrast Given 04/14/18 2304)  LORazepam (ATIVAN) injection 0.5 mg (0.5 mg Intravenous Given 04/14/18 2241)     Initial Impression /  Assessment and Plan / ED Course  I have reviewed the triage vital signs and the nursing notes.  Pertinent labs & imaging results that were available during my care of the patient were reviewed by me and considered in my medical decision making (see chart for details).   Nonspecific chest pain. Will get delta troponins and serial ecg's for same. Ct scan negative for pe. Will order outpatient [place] of LUE to eval for DVT (secondary to pain and elevated D dimer). No neck pain to suggest cervical issue, will refer to pcp for further workup for same.  Final Clinical Impressions(s) / ED Diagnoses   Final diagnoses:  Nonspecific chest pain    ED Discharge Orders        Ordered    US Venous Img Upper Uni Left     04/15/18 0012    HYDROcodone-acetaminophen (NORCO/VICODIN) 5-325 MG tablet  Every 4 hours PRN     04/15/18 0014       Michaline Kindig, Barbara Cower, MD 04/15/18 [id number]

## 2018-04-17 ENCOUNTER — Encounter: Payer: Self-pay | Admitting: Physician Assistant

## 2018-04-18 ENCOUNTER — Telehealth: Payer: Self-pay | Admitting: Physician Assistant

## 2018-04-18 ENCOUNTER — Other Ambulatory Visit: Payer: Self-pay

## 2018-04-18 DIAGNOSIS — D72829 Elevated white blood cell count, unspecified: Secondary | ICD-10-CM

## 2018-04-18 NOTE — Telephone Encounter (Signed)
Ultrasound: No evidence of DVT within the left upper extremity  Labs are normal overall except the CBC shows WBC count at 17,000. This can correspond with infection. Okay to recheck a CBC at out lab this week.

## 2018-04-18 NOTE — Telephone Encounter (Signed)
Patient notified of lab results. Will return in 1 week for recheck of CBC. Future order placed

## 2018-06-07 ENCOUNTER — Other Ambulatory Visit: Payer: Self-pay | Admitting: Physician Assistant

## 2018-06-07 DIAGNOSIS — F419 Anxiety disorder, unspecified: Secondary | ICD-10-CM

## 2018-06-07 MED ORDER — ALPRAZOLAM 0.5 MG PO TABS: 0.5000 mg | ORAL_TABLET | Freq: Two times a day (BID) | ORAL | 0 refills | Status: DC | PRN

## 2018-06-07 NOTE — Telephone Encounter (Signed)
What is the name of the medication? xanax  Have you contacted your pharmacy to request a refill? No, because she recently changed her name needs a new RX with her last name on file [place] 5[place]  Which pharmacy would you like this sent to? [facility]   Patient notified that their request is being sent to the clinical staff for review and that they should receive a call once it is complete. If they do not receive a call within 24 hours they can check with their pharmacy or our office.

## 2018-07-20 ENCOUNTER — Ambulatory Visit: Payer: BLUE CROSS/BLUE SHIELD | Admitting: Physician Assistant

## 2018-08-09 ENCOUNTER — Ambulatory Visit: Payer: BLUE CROSS/BLUE SHIELD | Admitting: Physician Assistant

## 2018-08-09 ENCOUNTER — Encounter: Payer: Self-pay | Admitting: Physician Assistant

## 2018-08-09 VITALS — BP 133/79 | HR 84 | Temp 98.5°F | Ht 67.0 in | Wt 156.6 lb

## 2018-08-09 DIAGNOSIS — N898 Other specified noninflammatory disorders of vagina: Secondary | ICD-10-CM | POA: Diagnosis not present

## 2018-08-09 DIAGNOSIS — F339 Major depressive disorder, recurrent, unspecified: Secondary | ICD-10-CM | POA: Diagnosis not present

## 2018-08-09 DIAGNOSIS — F4321 Adjustment disorder with depressed mood: Secondary | ICD-10-CM

## 2018-08-09 DIAGNOSIS — F419 Anxiety disorder, unspecified: Secondary | ICD-10-CM

## 2018-08-09 DIAGNOSIS — F4320 Adjustment disorder, unspecified: Secondary | ICD-10-CM

## 2018-08-09 DIAGNOSIS — K2100 Gastro-esophageal reflux disease with esophagitis, without bleeding: Secondary | ICD-10-CM

## 2018-08-09 DIAGNOSIS — R1313 Dysphagia, pharyngeal phase: Secondary | ICD-10-CM

## 2018-08-09 DIAGNOSIS — R499 Unspecified voice and resonance disorder: Secondary | ICD-10-CM

## 2018-08-09 LAB — WET PREP FOR TRICH, YEAST, CLUE
CLUE CELL EXAM: NEGATIVE
Trichomonas Exam: NEGATIVE
Yeast Exam: NEGATIVE

## 2018-08-09 MED ORDER — BUPROPION HCL ER (SR) 150 MG PO TB12: 150.0000 mg | ORAL_TABLET | Freq: Two times a day (BID) | ORAL | 0 refills | Status: DC

## 2018-08-09 MED ORDER — OMEPRAZOLE 40 MG PO CPDR: 40.0000 mg | DELAYED_RELEASE_CAPSULE | Freq: Every day | ORAL | 11 refills | Status: DC

## 2018-08-09 MED ORDER — ARIPIPRAZOLE 5 MG PO TABS: 5.0000 mg | ORAL_TABLET | Freq: Every day | ORAL | 1 refills | Status: DC

## 2018-08-09 MED ORDER — ALPRAZOLAM 0.5 MG PO TABS: 0.5000 mg | ORAL_TABLET | Freq: Two times a day (BID) | ORAL | 0 refills | Status: DC | PRN

## 2018-08-09 NOTE — Progress Notes (Signed)
BP 133/79   Pulse 84   Temp 98.5 F (36.9 C) (Oral)   Ht 5\' 7"  (1.702 m)   Wt 156 lb 9.6 oz (71 kg)   BMI 24.53 kg/m    Subjective:    Patient ID: Gabrielle [name], female    DOB: [birth year], 42 y.o.   MRN: [id number]  HPI: Gabrielle [name] is a 42 y.o. female presenting on 08/09/2018 for Anxiety and Vaginal Discharge  This patient comes in for periodic recheck on medications and conditions including depression, anxiety, grief reaction, vaginitis. Has not been taking her medications and is extremely depressed. Having some panic episodes. Has a lot of myalgias through this past couple of months.   All medications are reviewed today. There are no reports of any problems with the medications. All of the medical conditions are reviewed and updated.  Lab work is reviewed and will be ordered as medically necessary. There are no new problems reported with today's visit.   Past Medical History:  Diagnosis Date  . Anxiety   . Fibromyalgia   . Fibromyalgia   . Hypertension    Relevant past medical, surgical, family and social history reviewed and updated as indicated. Interim medical history since our last visit reviewed. Allergies and medications reviewed and updated. DATA REVIEWED: CHART IN EPIC  Family History reviewed for pertinent findings.  Review of Systems  Constitutional: Negative.  Negative for activity change, fatigue and fever.  HENT: Negative.   Eyes: Negative.   Respiratory: Negative.  Negative for cough.   Cardiovascular: Positive for chest pain.  Gastrointestinal: Negative.  Negative for abdominal pain.  Endocrine: Negative.   Genitourinary: Negative.  Negative for dysuria.  Musculoskeletal: Negative.   Skin: Negative.   Neurological: Negative for tremors.  Psychiatric/Behavioral: Positive for agitation, dysphoric mood and sleep disturbance. Negative for suicidal ideas. The patient is nervous/anxious. The patient is not hyperactive.     Allergies as of 08/09/2018        Reactions   Morphine And Related    Percocet [oxycodone-acetaminophen] Other (See Comments)   Chest pain       Medication List        Accurate as of 08/09/18  2:12 PM. Always use your most recent med list.          acetaminophen 500 MG tablet Commonly known as:  TYLENOL Take 1,000-1,500 mg by mouth every 6 (six) hours as needed for headache.   ALPRAZolam 0.5 MG tablet Commonly known as:  XANAX Take 1 tablet (0.5 mg total) by mouth 2 (two) times daily as needed for anxiety.   aripiprazole 10 MG disintegrating tablet Commonly known as:  ABILIFY Take 1 tablet (10 mg total) by mouth daily.   citalopram 40 MG tablet Commonly known as:  CELEXA Take 1 tablet (40 mg total) by mouth daily.   gabapentin 400 MG capsule Commonly known as:  NEURONTIN Take 1 capsule (400 mg total) by mouth 3 (three) times daily.   loratadine 10 MG tablet Commonly known as:  CLARITIN Take 1 tablet (10 mg total) by mouth daily.          Objective:    BP 133/79   Pulse 84   Temp 98.5 F (36.9 C) (Oral)   Ht 5\' 7"  (1.702 m)   Wt 156 lb 9.6 oz (71 kg)   BMI 24.53 kg/m   Allergies  Allergen Reactions  . Morphine And Related   . Percocet [Oxycodone-Acetaminophen] Other (See Comments)    Chest  pain     Wt Readings from Last 3 Encounters:  08/09/18 156 lb 9.6 oz (71 kg)  04/14/18 146 lb (66.2 kg)  03/25/18 146 lb 3.2 oz (66.3 kg)    Physical Exam  Constitutional: She is oriented to person, place, and time. She appears well-developed and well-nourished.  HENT:  Head: Normocephalic and atraumatic.  Right Ear: Tympanic membrane, external ear and ear canal normal.  Left Ear: Tympanic membrane, external ear and ear canal normal.  Nose: Nose normal. No rhinorrhea.  Mouth/Throat: Oropharynx is clear and moist and mucous membranes are normal. No oropharyngeal exudate or posterior oropharyngeal erythema.  Eyes: Pupils are equal, round, and reactive to light. Conjunctivae and EOM are  normal.  Neck: Normal range of motion. Neck supple.  Cardiovascular: Normal rate, regular rhythm, normal heart sounds and intact distal pulses.  Pulmonary/Chest: Effort normal and breath sounds normal.  Abdominal: Soft. Bowel sounds are normal.  Neurological: She is alert and oriented to person, place, and time. She has normal reflexes.  Skin: Skin is warm and dry. No rash noted.  Psychiatric: Her speech is normal and behavior is normal. Judgment and thought content normal. Her mood appears anxious. Cognition and memory are normal. She exhibits a depressed mood.    Results for orders placed or performed during the hospital encounter of 04/14/18  Basic metabolic panel  Result Value Ref Range   Sodium 137 135 - 145 mmol/L   Potassium 4.3 3.5 - 5.1 mmol/L   Chloride 104 101 - 111 mmol/L   CO2 24 22 - 32 mmol/L   Glucose, Bld 95 65 - 99 mg/dL   BUN 18 6 - 20 mg/dL   Creatinine, Ser 1.61 0.44 - 1.00 mg/dL   Calcium 9.3 8.9 - 09.6 mg/dL   GFR calc non Af Amer >60 >60 mL/min   GFR calc Af Amer >60 >60 mL/min   Anion gap 9 5 - 15  CBC  Result Value Ref Range   WBC 17.0 (H) 4.0 - 10.5 K/uL   RBC 4.93 3.87 - 5.11 MIL/uL   Hemoglobin 14.3 12.0 - 15.0 g/dL   HCT 04.5 40.9 - 81.1 %   MCV 85.6 78.0 - 100.0 fL   MCH 29.0 26.0 - 34.0 pg   MCHC 33.9 30.0 - 36.0 g/dL   RDW 91.4 78.2 - 95.6 %   Platelets 255 150 - 400 K/uL  Troponin I  Result Value Ref Range   Troponin I <0.03 <0.03 ng/mL  Urinalysis, Routine w reflex microscopic  Result Value Ref Range   Color, Urine YELLOW YELLOW   APPearance CLEAR CLEAR   Specific Gravity, Urine 1.020 1.005 - 1.030   pH 5.0 5.0 - 8.0   Glucose, UA NEGATIVE NEGATIVE mg/dL   Hgb urine dipstick NEGATIVE NEGATIVE   Bilirubin Urine NEGATIVE NEGATIVE   Ketones, ur NEGATIVE NEGATIVE mg/dL   Protein, ur NEGATIVE NEGATIVE mg/dL   Nitrite NEGATIVE NEGATIVE   Leukocytes, UA NEGATIVE NEGATIVE  D-dimer, quantitative (not at [facility])  Result Value Ref Range    D-Dimer, Quant 0.82 (H) 0.00 - 0.50 ug/mL-FEU  Troponin I  Result Value Ref Range   Troponin I <0.03 <0.03 ng/mL      Assessment & Plan:   1. Vaginal discharge - WET PREP FOR TRICH, YEAST, CLUE  2. Depression, recurrent (HCC) - buPROPion (WELLBUTRIN SR) 150 MG 12 hr tablet; Take 1 tablet (150 mg total) by mouth 2 (two) times daily.  Dispense: 60 tablet; Refill: 0 - ARIPiprazole (  ABILIFY) 5 MG tablet; Take 1 tablet (5 mg total) by mouth daily.  Dispense: 30 tablet; Refill: 1  3. Anxiety - ALPRAZolam (XANAX) 0.5 MG tablet; Take 1 tablet (0.5 mg total) by mouth 2 (two) times daily as needed for anxiety.  Dispense: 60 tablet; Refill: 0  4. Grief reaction - ALPRAZolam (XANAX) 0.5 MG tablet; Take 1 tablet (0.5 mg total) by mouth 2 (two) times daily as needed for anxiety.  Dispense: 60 tablet; Refill: 0  5. Gastroesophageal reflux disease with esophagitis - omeprazole (PRILOSEC) 40 MG capsule; Take 1 capsule (40 mg total) by mouth daily.  Dispense: 30 capsule; Refill: 11  6. Hoarseness or changing voice - Ambulatory referral to ENT  7. Pharyngeal dysphagia - Ambulatory referral to ENT   Continue all other maintenance medications as listed above.  Follow up plan: No follow-ups on file.  Educational handout given for survey  Remus Loffler PA-C [facility] [address]  [place], [place] [address] [phone]   08/09/2018, 2:12 PM

## 2018-08-30 ENCOUNTER — Ambulatory Visit: Payer: BLUE CROSS/BLUE SHIELD | Admitting: Physician Assistant

## 2018-09-06 ENCOUNTER — Other Ambulatory Visit: Payer: Self-pay | Admitting: Otolaryngology

## 2018-09-06 DIAGNOSIS — R1314 Dysphagia, pharyngoesophageal phase: Secondary | ICD-10-CM

## 2018-09-07 ENCOUNTER — Other Ambulatory Visit: Payer: Self-pay | Admitting: Physician Assistant

## 2018-09-07 DIAGNOSIS — F4321 Adjustment disorder with depressed mood: Secondary | ICD-10-CM

## 2018-09-07 DIAGNOSIS — F419 Anxiety disorder, unspecified: Secondary | ICD-10-CM

## 2018-09-12 ENCOUNTER — Other Ambulatory Visit: Payer: BLUE CROSS/BLUE SHIELD

## 2018-09-19 ENCOUNTER — Ambulatory Visit: Payer: BLUE CROSS/BLUE SHIELD | Admitting: Physician Assistant

## 2018-09-21 ENCOUNTER — Encounter: Payer: Self-pay | Admitting: Physician Assistant

## 2018-10-14 ENCOUNTER — Telehealth: Payer: Self-pay | Admitting: Physician Assistant

## 2018-10-14 ENCOUNTER — Other Ambulatory Visit: Payer: Self-pay | Admitting: Physician Assistant

## 2018-10-14 DIAGNOSIS — F419 Anxiety disorder, unspecified: Secondary | ICD-10-CM

## 2018-10-14 DIAGNOSIS — F4321 Adjustment disorder with depressed mood: Secondary | ICD-10-CM

## 2018-10-14 MED ORDER — IBUPROFEN 800 MG PO TABS: 800.0000 mg | ORAL_TABLET | Freq: Three times a day (TID) | ORAL | 0 refills | Status: DC | PRN

## 2018-10-14 NOTE — Telephone Encounter (Signed)
sent 

## 2018-10-14 NOTE — Telephone Encounter (Signed)
PT states that her fibromyalgia is flaring up and wants to know if [facility] can call [facility]

## 2018-10-14 NOTE — Telephone Encounter (Signed)
Patient aware.

## 2018-10-24 ENCOUNTER — Encounter: Payer: Self-pay | Admitting: Physician Assistant

## 2018-10-24 ENCOUNTER — Ambulatory Visit: Payer: BLUE CROSS/BLUE SHIELD | Admitting: Physician Assistant

## 2018-10-24 DIAGNOSIS — R300 Dysuria: Secondary | ICD-10-CM

## 2018-10-24 DIAGNOSIS — J0110 Acute frontal sinusitis, unspecified: Secondary | ICD-10-CM

## 2018-10-24 DIAGNOSIS — L03116 Cellulitis of left lower limb: Secondary | ICD-10-CM | POA: Diagnosis not present

## 2018-10-24 LAB — URINALYSIS, COMPLETE
Bilirubin, UA: NEGATIVE
GLUCOSE, UA: NEGATIVE
Ketones, UA: NEGATIVE
Leukocytes, UA: NEGATIVE
Nitrite, UA: NEGATIVE
PROTEIN UA: NEGATIVE
RBC, UA: NEGATIVE
Specific Gravity, UA: 1.015 (ref 1.005–1.030)
Urobilinogen, Ur: 0.2 mg/dL (ref 0.2–1.0)
pH, UA: 6 (ref 5.0–7.5)

## 2018-10-24 LAB — MICROSCOPIC EXAMINATION
Bacteria, UA: NONE SEEN
Renal Epithel, UA: NONE SEEN /hpf

## 2018-10-24 MED ORDER — DIPHENOXYLATE-ATROPINE 2.5-0.025 MG PO TABS: 1.0000 | ORAL_TABLET | Freq: Four times a day (QID) | ORAL | 0 refills | Status: AC | PRN

## 2018-10-24 MED ORDER — CEFDINIR 300 MG PO CAPS: 300.0000 mg | ORAL_CAPSULE | Freq: Two times a day (BID) | ORAL | 0 refills | Status: DC

## 2018-10-24 NOTE — Progress Notes (Signed)
There were no vitals taken for this visit.   Subjective:    Patient ID: Gabrielle [name], female    DOB: 15-Jun-1976, 42 y.o.   MRN: [id number]  HPI: Gabrielle [name] is a 42 y.o. female presenting on 10/24/2018 for Sore Throat; Abdominal Pain; Urinary Tract Infection; and Diarrhea  This patient has had many days of sore throat and postnasal drainage, headache at times and sinus pressure. There is copious drainage at times. Denies any fever at this time. There has been a history of sinus infections in the past.  There is cough at night. It has become more prevalent in recent days.  This patient has had several days of dysuria, frequency and nocturia. There is also pain over the bladder in the suprapubic region, no back pain. Denies leakage or hematuria.  Denies fever or chills. No pain in flank area.   Past Medical History:  Diagnosis Date  . Anxiety   . Fibromyalgia   . Fibromyalgia   . Hypertension    Relevant past medical, surgical, family and social history reviewed and updated as indicated. Interim medical history since our last visit reviewed. Allergies and medications reviewed and updated. DATA REVIEWED: CHART IN EPIC  Family History reviewed for pertinent findings.  Review of Systems  Constitutional: Positive for chills and fatigue. Negative for activity change and appetite change.  HENT: Positive for congestion, postnasal drip and sore throat.   Eyes: Negative.   Respiratory: Positive for cough and wheezing.   Cardiovascular: Negative.  Negative for chest pain, palpitations and leg swelling.  Gastrointestinal: Negative.   Genitourinary: Positive for frequency and urgency.  Musculoskeletal: Negative.   Skin: Negative.   Neurological: Positive for headaches.    Allergies as of 10/24/2018      Reactions   Morphine And Related    Percocet [oxycodone-acetaminophen] Other (See Comments)   Chest pain       Medication List        Accurate as of 10/24/18 10:00 PM.  Always use your most recent med list.          acetaminophen 500 MG tablet Commonly known as:  TYLENOL Take 1,000-1,500 mg by mouth every 6 (six) hours as needed for headache.   ALPRAZolam 0.5 MG tablet Commonly known as:  XANAX TAKE 1 TABLET BY MOUTH TWICE DAILY AS NEEDED FOR ANXIETY   ARIPiprazole 5 MG tablet Commonly known as:  ABILIFY Take 1 tablet (5 mg total) by mouth daily.   buPROPion 150 MG 12 hr tablet Commonly known as:  WELLBUTRIN SR Take 1 tablet (150 mg total) by mouth 2 (two) times daily.   cefdinir 300 MG capsule Commonly known as:  OMNICEF Take 1 capsule (300 mg total) by mouth 2 (two) times daily. 1 po BID   diphenoxylate-atropine 2.5-0.025 MG tablet Commonly known as:  LOMOTIL Take 1 tablet by mouth 4 (four) times daily as needed for diarrhea or loose stools.   gabapentin 400 MG capsule Commonly known as:  NEURONTIN Take 1 capsule (400 mg total) by mouth 3 (three) times daily.   ibuprofen 800 MG tablet Commonly known as:  ADVIL,MOTRIN Take 1 tablet (800 mg total) by mouth every 8 (eight) hours as needed.   loratadine 10 MG tablet Commonly known as:  CLARITIN Take 1 tablet (10 mg total) by mouth daily.   omeprazole 40 MG capsule Commonly known as:  PRILOSEC Take 1 capsule (40 mg total) by mouth daily.  Objective:    There were no vitals taken for this visit.  Allergies  Allergen Reactions  . Morphine And Related   . Percocet [Oxycodone-Acetaminophen] Other (See Comments)    Chest pain     Wt Readings from Last 3 Encounters:  08/09/18 156 lb 9.6 oz (71 kg)  04/14/18 146 lb (66.2 kg)  03/25/18 146 lb 3.2 oz (66.3 kg)    Physical Exam  Constitutional: She is oriented to person, place, and time. She appears well-developed and well-nourished.  HENT:  Head: Normocephalic and atraumatic.  Right Ear: A middle ear effusion is present.  Left Ear: A middle ear effusion is present.  Nose: Mucosal edema present. Right sinus exhibits  no frontal sinus tenderness. Left sinus exhibits no frontal sinus tenderness.  Mouth/Throat: Posterior oropharyngeal erythema present. No oropharyngeal exudate or tonsillar abscesses.  Eyes: Pupils are equal, round, and reactive to light. Conjunctivae and EOM are normal.  Neck: Normal range of motion.  Cardiovascular: Normal rate, regular rhythm, normal heart sounds and intact distal pulses.  Pulmonary/Chest: Effort normal and breath sounds normal.  Abdominal: Soft. Bowel sounds are normal.  Neurological: She is alert and oriented to person, place, and time. She has normal reflexes.  Skin: Skin is warm and dry. No rash noted.  Psychiatric: She has a normal mood and affect. Her behavior is normal. Judgment and thought content normal.  Nursing note and vitals reviewed.   Results for orders placed or performed in visit on 10/24/18  Microscopic Examination  Result Value Ref Range   WBC, UA 0-5 0 - 5 /hpf   RBC, UA 0-2 0 - 2 /hpf   Epithelial Cells (non renal) 0-10 0 - 10 /hpf   Renal Epithel, UA None seen None seen /hpf   Bacteria, UA None seen None seen/Few  Urinalysis, Complete  Result Value Ref Range   Specific Gravity, UA 1.015 1.005 - 1.030   pH, UA 6.0 5.0 - 7.5   Color, UA Yellow Yellow   Appearance Ur Clear Clear   Leukocytes, UA Negative Negative   Protein, UA Negative Negative/Trace   Glucose, UA Negative Negative   Ketones, UA Negative Negative   RBC, UA Negative Negative   Bilirubin, UA Negative Negative   Urobilinogen, Ur 0.2 0.2 - 1.0 mg/dL   Nitrite, UA Negative Negative   Microscopic Examination See below:       Assessment & Plan:   1. Dysuria - Urine Culture - Urinalysis, Complete - Microscopic Examination  2. Acute non-recurrent frontal sinusitis - cefdinir (OMNICEF) 300 MG capsule; Take 1 capsule (300 mg total) by mouth 2 (two) times daily. 1 po BID  Dispense: 20 capsule; Refill: 0  3. Cellulitis of left lower extremity - cefdinir (OMNICEF) 300 MG  capsule; Take 1 capsule (300 mg total) by mouth 2 (two) times daily. 1 po BID  Dispense: 20 capsule; Refill: 0   Continue all other maintenance medications as listed above.  Follow up plan: Return if symptoms worsen or fail to improve.  Educational handout given for survey  Remus Loffler PA-C [facility] [address]  [place], [place] [address] [phone]   10/24/2018, 10:00 PM

## 2018-10-25 LAB — URINE CULTURE

## 2018-11-07 ENCOUNTER — Other Ambulatory Visit: Payer: Self-pay | Admitting: Physician Assistant

## 2018-11-07 DIAGNOSIS — F339 Major depressive disorder, recurrent, unspecified: Secondary | ICD-10-CM

## 2018-11-14 ENCOUNTER — Other Ambulatory Visit: Payer: Self-pay | Admitting: Physician Assistant

## 2018-11-14 DIAGNOSIS — F419 Anxiety disorder, unspecified: Secondary | ICD-10-CM

## 2018-11-14 DIAGNOSIS — F4321 Adjustment disorder with depressed mood: Secondary | ICD-10-CM

## 2018-11-15 NOTE — Telephone Encounter (Signed)
Last seen 10/24/18 

## 2018-12-10 ENCOUNTER — Other Ambulatory Visit: Payer: Self-pay | Admitting: Physician Assistant

## 2018-12-10 DIAGNOSIS — J0110 Acute frontal sinusitis, unspecified: Secondary | ICD-10-CM

## 2018-12-10 DIAGNOSIS — L03116 Cellulitis of left lower limb: Secondary | ICD-10-CM

## 2018-12-10 MED ORDER — CEFDINIR 300 MG PO CAPS: 300.0000 mg | ORAL_CAPSULE | Freq: Two times a day (BID) | ORAL | 0 refills | Status: DC

## 2018-12-13 ENCOUNTER — Ambulatory Visit: Payer: BLUE CROSS/BLUE SHIELD | Admitting: Physician Assistant

## 2018-12-15 ENCOUNTER — Encounter: Payer: Self-pay | Admitting: Physician Assistant

## 2019-01-02 ENCOUNTER — Ambulatory Visit: Payer: BLUE CROSS/BLUE SHIELD | Admitting: Physician Assistant

## 2019-01-02 ENCOUNTER — Encounter: Payer: Self-pay | Admitting: Physician Assistant

## 2019-01-02 DIAGNOSIS — J301 Allergic rhinitis due to pollen: Secondary | ICD-10-CM

## 2019-01-02 DIAGNOSIS — M797 Fibromyalgia: Secondary | ICD-10-CM

## 2019-01-02 DIAGNOSIS — F4321 Adjustment disorder with depressed mood: Secondary | ICD-10-CM | POA: Diagnosis not present

## 2019-01-02 DIAGNOSIS — F339 Major depressive disorder, recurrent, unspecified: Secondary | ICD-10-CM

## 2019-01-02 DIAGNOSIS — F419 Anxiety disorder, unspecified: Secondary | ICD-10-CM | POA: Diagnosis not present

## 2019-01-02 MED ORDER — IBUPROFEN 800 MG PO TABS: 800.0000 mg | ORAL_TABLET | Freq: Three times a day (TID) | ORAL | 5 refills | Status: DC | PRN

## 2019-01-02 MED ORDER — GABAPENTIN 100 MG PO CAPS: 400.0000 mg | ORAL_CAPSULE | Freq: Three times a day (TID) | ORAL | 11 refills | Status: DC

## 2019-01-02 MED ORDER — LORATADINE 10 MG PO TABS: 10.0000 mg | ORAL_TABLET | Freq: Every day | ORAL | 11 refills | Status: AC

## 2019-01-02 MED ORDER — ARIPIPRAZOLE 5 MG PO TABS: 5.0000 mg | ORAL_TABLET | Freq: Every day | ORAL | 5 refills | Status: AC

## 2019-01-02 MED ORDER — BUPROPION HCL ER (SR) 150 MG PO TB12: 150.0000 mg | ORAL_TABLET | Freq: Two times a day (BID) | ORAL | 11 refills | Status: AC

## 2019-01-02 MED ORDER — PANTOPRAZOLE SODIUM 40 MG PO TBEC: 40.0000 mg | DELAYED_RELEASE_TABLET | Freq: Every day | ORAL | 11 refills | Status: AC

## 2019-01-02 MED ORDER — ALPRAZOLAM 0.5 MG PO TABS: 0.5000 mg | ORAL_TABLET | Freq: Two times a day (BID) | ORAL | 5 refills | Status: DC | PRN

## 2019-01-03 NOTE — Progress Notes (Signed)
BP 111/74   Pulse 89   Temp 97.9 F (36.6 C) (Oral)   Ht 5\' 7"  (1.702 m)   Wt 162 lb 6.4 oz (73.7 kg)   BMI 25.44 kg/m    Subjective:    Patient ID: Gabrielle [name], female    DOB: 1976/10/21, 43 y.o.   MRN: [id number]  HPI: Gabrielle [name] is a 43 y.o. female presenting on 01/02/2019 for Anxiety  This patient was pain in the left lateral knee.  She is doing a job where she stands and works in [employer].  She works very long hours.  She is working as a Production designer, theatre/television/film.  She has been using some over-the-counter medications without much relief.  She does need refills on some of her other medications that are for her depression, anxiety, mood disorder, GERD, chronic pain.  There are no other complaints at this time.  Past Medical History:  Diagnosis Date  . Anxiety   . Fibromyalgia   . Fibromyalgia   . Hypertension    Relevant past medical, surgical, family and social history reviewed and updated as indicated. Interim medical history since our last visit reviewed. Allergies and medications reviewed and updated. DATA REVIEWED: CHART IN EPIC  Family History reviewed for pertinent findings.  Review of Systems  Constitutional: Negative.   HENT: Negative.   Eyes: Negative.   Respiratory: Negative.   Gastrointestinal: Negative.   Genitourinary: Negative.   Musculoskeletal: Positive for joint swelling and myalgias.  Psychiatric/Behavioral: Positive for decreased concentration and dysphoric mood. The patient is nervous/anxious.     Allergies as of 01/02/2019      Reactions   Morphine And Related    Percocet [oxycodone-acetaminophen] Other (See Comments)   Chest pain       Medication List       Accurate as of January 02, 2019 11:59 PM. Always use your most recent med list.        acetaminophen 500 MG tablet Commonly known as:  TYLENOL Take 1,000-1,500 mg by mouth every 6 (six) hours as needed for headache.   ALPRAZolam 0.5 MG tablet Commonly known as:  XANAX Take 1  tablet (0.5 mg total) by mouth 2 (two) times daily as needed. for anxiety   ARIPiprazole 5 MG tablet Commonly known as:  ABILIFY Take 1 tablet (5 mg total) by mouth daily.   buPROPion 150 MG 12 hr tablet Commonly known as:  WELLBUTRIN SR Take 1 tablet (150 mg total) by mouth 2 (two) times daily.   diphenoxylate-atropine 2.5-0.025 MG tablet Commonly known as:  LOMOTIL Take 1 tablet by mouth 4 (four) times daily as needed for diarrhea or loose stools.   gabapentin 100 MG capsule Commonly known as:  NEURONTIN Take 4 capsules (400 mg total) by mouth 3 (three) times daily.   ibuprofen 800 MG tablet Commonly known as:  ADVIL,MOTRIN Take 1 tablet (800 mg total) by mouth every 8 (eight) hours as needed.   loratadine 10 MG tablet Commonly known as:  CLARITIN Take 1 tablet (10 mg total) by mouth daily.   pantoprazole 40 MG tablet Commonly known as:  PROTONIX Take 1 tablet (40 mg total) by mouth daily.          Objective:    BP 111/74   Pulse 89   Temp 97.9 F (36.6 C) (Oral)   Ht 5\' 7"  (1.702 m)   Wt 162 lb 6.4 oz (73.7 kg)   BMI 25.44 kg/m   Allergies  Allergen  Reactions  . Morphine And Related   . Percocet [Oxycodone-Acetaminophen] Other (See Comments)    Chest pain     Wt Readings from Last 3 Encounters:  01/02/19 162 lb 6.4 oz (73.7 kg)  08/09/18 156 lb 9.6 oz (71 kg)  04/14/18 146 lb (66.2 kg)    Physical Exam Constitutional:      Appearance: She is well-developed.  HENT:     Head: Normocephalic and atraumatic.  Eyes:     Conjunctiva/sclera: Conjunctivae normal.     Pupils: Pupils are equal, round, and reactive to light.  Cardiovascular:     Rate and Rhythm: Normal rate and regular rhythm.     Heart sounds: Normal heart sounds.  Pulmonary:     Effort: Pulmonary effort is normal.     Breath sounds: Normal breath sounds.  Abdominal:     General: Bowel sounds are normal.     Palpations: Abdomen is soft.  Skin:    General: Skin is warm and dry.      Findings: No rash.  Neurological:     Mental Status: She is alert and oriented to person, place, and time.     Deep Tendon Reflexes: Reflexes are normal and symmetric.  Psychiatric:        Behavior: Behavior normal.        Thought Content: Thought content normal.        Judgment: Judgment normal.     Results for orders placed or performed in visit on 10/24/18  Urine Culture  Result Value Ref Range   Urine Culture, Routine Final report    Organism ID, Bacteria Comment   Microscopic Examination  Result Value Ref Range   WBC, UA 0-5 0 - 5 /hpf   RBC, UA 0-2 0 - 2 /hpf   Epithelial Cells (non renal) 0-10 0 - 10 /hpf   Renal Epithel, UA None seen None seen /hpf   Bacteria, UA None seen None seen/Few  Urinalysis, Complete  Result Value Ref Range   Specific Gravity, UA 1.015 1.005 - 1.030   pH, UA 6.0 5.0 - 7.5   Color, UA Yellow Yellow   Appearance Ur Clear Clear   Leukocytes, UA Negative Negative   Protein, UA Negative Negative/Trace   Glucose, UA Negative Negative   Ketones, UA Negative Negative   RBC, UA Negative Negative   Bilirubin, UA Negative Negative   Urobilinogen, Ur 0.2 0.2 - 1.0 mg/dL   Nitrite, UA Negative Negative   Microscopic Examination See below:       Assessment & Plan:   1. Anxiety - ALPRAZolam (XANAX) 0.5 MG tablet; Take 1 tablet (0.5 mg total) by mouth 2 (two) times daily as needed. for anxiety  Dispense: 60 tablet; Refill: 5  2. Grief reaction - ALPRAZolam (XANAX) 0.5 MG tablet; Take 1 tablet (0.5 mg total) by mouth 2 (two) times daily as needed. for anxiety  Dispense: 60 tablet; Refill: 5  3. Fibromyalgia - gabapentin (NEURONTIN) 100 MG capsule; Take 4 capsules (400 mg total) by mouth 3 (three) times daily.  Dispense: 90 capsule; Refill: 11 - ibuprofen (ADVIL,MOTRIN) 800 MG tablet; Take 1 tablet (800 mg total) by mouth every 8 (eight) hours as needed.  Dispense: 60 tablet; Refill: 5  4. Non-seasonal allergic rhinitis due to pollen - loratadine  (CLARITIN) 10 MG tablet; Take 1 tablet (10 mg total) by mouth daily.  Dispense: 30 tablet; Refill: 11  5. Depression, recurrent (HCC) - buPROPion (WELLBUTRIN SR) 150 MG 12 hr tablet; Take  1 tablet (150 mg total) by mouth 2 (two) times daily.  Dispense: 60 tablet; Refill: 11 - ARIPiprazole (ABILIFY) 5 MG tablet; Take 1 tablet (5 mg total) by mouth daily.  Dispense: 30 tablet; Refill: 5   Continue all other maintenance medications as listed above.  Follow up plan: No follow-ups on file.  Educational handout given for survey  Remus LofflerAngel S. Mikenzie Mccannon PA-C [facility] [address]  [place], [place] [id number] [phone]   01/03/2019, 9:48 PM

## 2019-02-18 ENCOUNTER — Telehealth: Payer: Self-pay | Admitting: Physician Assistant

## 2019-02-18 NOTE — Telephone Encounter (Signed)
Pt said that she has seen on the web that she doesn't need to take idprophen due to it can cause her to get the coronavirus, pt wants to know if that is true.

## 2019-02-18 NOTE — Telephone Encounter (Signed)
Patient aware that ibuprofen does not cause the coronavirus.

## 2019-02-20 ENCOUNTER — Telehealth: Payer: Self-pay | Admitting: Physician Assistant

## 2019-02-20 ENCOUNTER — Telehealth: Payer: BLUE CROSS/BLUE SHIELD | Admitting: Family Medicine

## 2019-02-20 NOTE — Telephone Encounter (Signed)
Spoke with husband.  They are at the ED for evaluation. Ok to provide note x2 weeks.

## 2019-02-20 NOTE — Telephone Encounter (Signed)
Letter ready.

## 2019-02-20 NOTE — Telephone Encounter (Signed)
[facility]  SWITCHBOARD  SICK CALL SCREENING   02/20/2019  Gabrielle [name]    OIL:[id number]    DOB:1976-04-17  Best Contact Telephone Number: [phone]   1. Symptoms: vomiting, diarrhea, headache, sore throat, cough, fever, ear pain  2. Symptom Onset: 3 days   3. Have you traveled any over the past 14 days? No  4.   Have you been in recent contact with someone that has tested positive for COVID-19? Unsure works with the public   5.   Which pharmacy would you use today if given a prescription? [facility] [place]   Follow-Up Plan Gabrielle [name] was informed that this information will be given to a clinical staff member to review and that they should receive a follow-up telephone call within 24 hours. she was advised to call back if she develops any new symptoms or if her current symptoms worsen.   Screened by: Josetta Huddle, LPN.  Seen at urgent care and tested negative for flu and strep.  Given nausea medicine with no relief.  Needs work note

## 2019-02-20 NOTE — Telephone Encounter (Signed)
[place]

## 2019-02-22 ENCOUNTER — Telehealth: Payer: Self-pay | Admitting: Family Medicine

## 2019-02-22 DIAGNOSIS — J208 Acute bronchitis due to other specified organisms: Secondary | ICD-10-CM

## 2019-02-22 DIAGNOSIS — B9689 Other specified bacterial agents as the cause of diseases classified elsewhere: Secondary | ICD-10-CM

## 2019-02-22 MED ORDER — BENZONATATE 100 MG PO CAPS: 100.0000 mg | ORAL_CAPSULE | Freq: Three times a day (TID) | ORAL | 0 refills | Status: DC | PRN

## 2019-02-22 MED ORDER — DOXYCYCLINE HYCLATE 100 MG PO TABS: 100.0000 mg | ORAL_TABLET | Freq: Two times a day (BID) | ORAL | 0 refills | Status: AC

## 2019-02-22 NOTE — Telephone Encounter (Signed)
Telephone visit  Subjective: Gabrielle [name] PCP: Remus Loffler, PA-C JRP:ZPSUGA Gabrielle [name] is Gabrielle 43 y.o. female calls for telephone consult today. Patient provides verbal consent for consult held via phone.  Location of patient: home Location of provider: [facility] Others present for call: none  1. Cough Patient calls reports Gabrielle greater than 1 week history of productive cough with clear sputum.  She notes that she was recently hospitalized at [facility] rocking him for Gabrielle gastroenteritis that was determined to be viral.  She did not get Gabrielle chest x-ray there but was told that she had an elevated white blood cell count to 11.1 with associated elevated platelets.  She denies any hemoptysis, shortness of breath, wheeze or measured fevers.  No known history of COPD but she has been told that she potentially had emphysema in the past.  She reports having responded to prednisone in the past when she has had similar episodes.   ROS: Per HPI  Allergies  Allergen Reactions  . Morphine And Related   . Percocet [Oxycodone-Acetaminophen] Other (See Comments)    Chest pain    Past Medical History:  Diagnosis Date  . Anxiety   . Fibromyalgia   . Fibromyalgia   . Hypertension     Current Outpatient Medications:  .  acetaminophen (TYLENOL) 500 MG tablet, Take 1,000-1,500 mg by mouth every 6 (six) hours as needed for headache., Disp: , Rfl:  .  ALPRAZolam (XANAX) 0.5 MG tablet, Take 1 tablet (0.5 mg total) by mouth 2 (two) times daily as needed. for anxiety, Disp: 60 tablet, Rfl: 5 .  ARIPiprazole (ABILIFY) 5 MG tablet, Take 1 tablet (5 mg total) by mouth daily., Disp: 30 tablet, Rfl: 5 .  buPROPion (WELLBUTRIN SR) 150 MG 12 hr tablet, Take 1 tablet (150 mg total) by mouth 2 (two) times daily., Disp: 60 tablet, Rfl: 11 .  diphenoxylate-atropine (LOMOTIL) 2.5-0.025 MG tablet, Take 1 tablet by mouth 4 (four) times daily as needed for diarrhea or loose stools., Disp: 30 tablet, Rfl: 0 .  gabapentin (NEURONTIN) 100 MG  capsule, Take 4 capsules (400 mg total) by mouth 3 (three) times daily., Disp: 90 capsule, Rfl: 11 .  ibuprofen (ADVIL,MOTRIN) 800 MG tablet, Take 1 tablet (800 mg total) by mouth every 8 (eight) hours as needed., Disp: 60 tablet, Rfl: 5 .  loratadine (CLARITIN) 10 MG tablet, Take 1 tablet (10 mg total) by mouth daily., Disp: 30 tablet, Rfl: 11 .  pantoprazole (PROTONIX) 40 MG tablet, Take 1 tablet (40 mg total) by mouth daily., Disp: 30 tablet, Rfl: 11  Assessment/ Plan: 43 y.o. female   1. Acute bacterial bronchitis At this time, I have Gabrielle low suspicion for COPD exacerbation given lack of wheezing and shortness of breath.  Given lack of formal diagnosis I am going to proceed with treatment for assumed bacterial bronchitis versus pneumonia given elevated white blood cell count during her hospitalization.  Doxycycline 100 mg p.o. twice daily for 7 days sent.  Tessalon Perles sent.  Discussed reasons for reevaluation and emergent evaluation emergency department the patient.  She was good understanding will follow-up in 2 days with updates on how she is doing.  In the interim, will CC to PCP in efforts to get her chart and results from her recent stay at [facility]. - doxycycline (VIBRA-TABS) 100 MG tablet; Take 1 tablet (100 mg total) by mouth 2 (two) times daily for 7 days.  Dispense: 14 tablet; Refill: 0 - benzonatate (TESSALON PERLES) 100 MG capsule;  Take 1 capsule (100 mg total) by mouth 3 (three) times daily as needed.  Dispense: 20 capsule; Refill: 0   Start time: 6:28pm End time: 6:36pm  No orders of the defined types were placed in this encounter.   Raliegh Ip, DO [facility] [phone]

## 2019-02-23 ENCOUNTER — Other Ambulatory Visit: Payer: Self-pay

## 2019-02-23 ENCOUNTER — Encounter: Payer: Self-pay | Admitting: Physician Assistant

## 2019-02-23 ENCOUNTER — Ambulatory Visit (INDEPENDENT_AMBULATORY_CARE_PROVIDER_SITE_OTHER): Payer: BLUE CROSS/BLUE SHIELD | Admitting: Physician Assistant

## 2019-02-23 DIAGNOSIS — J22 Unspecified acute lower respiratory infection: Secondary | ICD-10-CM | POA: Diagnosis not present

## 2019-02-23 DIAGNOSIS — R509 Fever, unspecified: Secondary | ICD-10-CM | POA: Insufficient documentation

## 2019-02-23 DIAGNOSIS — B9729 Other coronavirus as the cause of diseases classified elsewhere: Secondary | ICD-10-CM | POA: Diagnosis not present

## 2019-02-23 DIAGNOSIS — J989 Respiratory disorder, unspecified: Secondary | ICD-10-CM | POA: Insufficient documentation

## 2019-02-23 MED ORDER — ALBUTEROL SULFATE HFA 108 (90 BASE) MCG/ACT IN AERS: 2.0000 | INHALATION_SPRAY | Freq: Four times a day (QID) | RESPIRATORY_TRACT | 0 refills | Status: DC | PRN

## 2019-02-23 NOTE — Progress Notes (Signed)
Telephone visit  Subjective: CC: Respiratory illness that is not healing PCP: Remus Loffler, PA-C NWM:Gabrielle [name] is a 43 y.o. female calls for telephone consult today. Patient provides verbal consent for consult held via phone.  Location of patient: Home Location of provider: [facility] Others present for call: No  Extremely stable ill with viral respiratory illness.  She was seen through urgent care 6 days ago.  They were told that she had a viral illness.  She was tested for RSV, flu and strep and all were negative.  They did perform a chest x-ray at that time and there was no pneumonia.  However in few days she had to go back to the emergency department.  While she was there the labs were elevated and she did have an elevated white blood cell count.  They were unable to test her for coronavirus and no order was given.  She states that her niece is currently in quarantine.  And that her sister the mother of the niece was at their house in recent days.  I do feel most likely Gabrielle [name] has COVID-19.  I have given her full instructions on self quarantining at her house.  She does have a husband that lives in the home.  She will go to 1 bedroom and use this he will provide her with meals and her medications.  She will complete the medications that she has been given.  I have added albuterol inhaler for her to use,  if things get worse or she has any severe chest pain she is to go to the emergency room.  Note will be given for her to be out for the next 2 weeks.  The letter will be sent through her my chart.   ROS: Per HPI  Allergies  Allergen Reactions  . Morphine And Related   . Percocet [Oxycodone-Acetaminophen] Other (See Comments)    Chest pain    Past Medical History:  Diagnosis Date  . Anxiety   . Fibromyalgia   . Fibromyalgia   . Hypertension     Current Outpatient Medications:  .  acetaminophen (TYLENOL) 500 MG tablet, Take 1,000-1,500 mg by mouth every 6 (six) hours as  needed for headache., Disp: , Rfl:  .  albuterol (PROVENTIL HFA;VENTOLIN HFA) 108 (90 Base) MCG/ACT inhaler, Inhale 2 puffs into the lungs every 6 (six) hours as needed for wheezing or shortness of breath., Disp: 1 Inhaler, Rfl: 0 .  ALPRAZolam (XANAX) 0.5 MG tablet, Take 1 tablet (0.5 mg total) by mouth 2 (two) times daily as needed. for anxiety, Disp: 60 tablet, Rfl: 5 .  ARIPiprazole (ABILIFY) 5 MG tablet, Take 1 tablet (5 mg total) by mouth daily., Disp: 30 tablet, Rfl: 5 .  benzonatate (TESSALON PERLES) 100 MG capsule, Take 1 capsule (100 mg total) by mouth 3 (three) times daily as needed., Disp: 20 capsule, Rfl: 0 .  buPROPion (WELLBUTRIN SR) 150 MG 12 hr tablet, Take 1 tablet (150 mg total) by mouth 2 (two) times daily., Disp: 60 tablet, Rfl: 11 .  diphenoxylate-atropine (LOMOTIL) 2.5-0.025 MG tablet, Take 1 tablet by mouth 4 (four) times daily as needed for diarrhea or loose stools., Disp: 30 tablet, Rfl: 0 .  doxycycline (VIBRA-TABS) 100 MG tablet, Take 1 tablet (100 mg total) by mouth 2 (two) times daily for 7 days., Disp: 14 tablet, Rfl: 0 .  gabapentin (NEURONTIN) 100 MG capsule, Take 4 capsules (400 mg total) by mouth 3 (three) times daily., Disp:  90 capsule, Rfl: 11 .  ibuprofen (ADVIL,MOTRIN) 800 MG tablet, Take 1 tablet (800 mg total) by mouth every 8 (eight) hours as needed., Disp: 60 tablet, Rfl: 5 .  loratadine (CLARITIN) 10 MG tablet, Take 1 tablet (10 mg total) by mouth daily., Disp: 30 tablet, Rfl: 11 .  pantoprazole (PROTONIX) 40 MG tablet, Take 1 tablet (40 mg total) by mouth daily., Disp: 30 tablet, Rfl: 11  Assessment/ Plan: 43 y.o. female   1. Disease due to 2019 Novel Coronavirus Self quarantine instructions are given Note to be out for 2 weeks from now through March 09, 2019 has been sent through her my chart.  She will get it to her work.  Call back if any symptoms change.  If anything worsens go to the emergency room. - albuterol (PROVENTIL HFA;VENTOLIN HFA) 108 (90  Base) MCG/ACT inhaler; Inhale 2 puffs into the lungs every 6 (six) hours as needed for wheezing or shortness of breath.  Dispense: 1 Inhaler; Refill: 0   Start time: 3:02 pm End time: 3:17 pm  Meds ordered this encounter  Medications  . albuterol (PROVENTIL HFA;VENTOLIN HFA) 108 (90 Base) MCG/ACT inhaler    Sig: Inhale 2 puffs into the lungs every 6 (six) hours as needed for wheezing or shortness of breath.    Dispense:  1 Inhaler    Refill:  0    Order Specific Question:   Supervising Provider    Answer:   Raliegh Ip [[id number]]  Continue all of the other medications she is on at this time.  Which includes doxycycline and Tessalon Perles  Prudy Feeler PA-C [facility] [phone]

## 2019-03-31 ENCOUNTER — Ambulatory Visit (INDEPENDENT_AMBULATORY_CARE_PROVIDER_SITE_OTHER): Payer: BLUE CROSS/BLUE SHIELD | Admitting: Family

## 2019-03-31 ENCOUNTER — Encounter: Payer: Self-pay | Admitting: Family

## 2019-03-31 ENCOUNTER — Other Ambulatory Visit: Payer: Self-pay

## 2019-03-31 DIAGNOSIS — R399 Unspecified symptoms and signs involving the genitourinary system: Secondary | ICD-10-CM

## 2019-03-31 MED ORDER — CIPROFLOXACIN HCL 500 MG PO TABS: 500.0000 mg | ORAL_TABLET | Freq: Two times a day (BID) | ORAL | 0 refills | Status: DC

## 2019-03-31 NOTE — Progress Notes (Signed)
   Virtual Visit via telephone Note  I connected with Linus Mako on 03/31/19 at 12:08 AM by telephone and verified that I am speaking with the correct person using two identifiers. Linus Mako is currently located at home  and no one  is currently with her during visit. The provider, Jannifer Rodney, FNP is located in their office at time of visit.  I discussed the limitations, risks, security and privacy concerns of performing an evaluation and management service by telephone and the availability of in person appointments. I also discussed with the patient that there may be a patient responsible charge related to this service. The patient expressed understanding and agreed to proceed.   History and Present Illness:  Dysuria   This is a new problem. The current episode started yesterday. The problem occurs every urination. The problem has been gradually worsening. The pain is at a severity of 6/10. The pain is mild. Associated symptoms include flank pain, frequency, hesitancy and nausea. Pertinent negatives include no chills, discharge, hematuria or urgency. She has tried increased fluids for the symptoms. The treatment provided no relief.      Review of Systems  Constitutional: Negative for chills.  Gastrointestinal: Positive for nausea.  Genitourinary: Positive for dysuria, flank pain, frequency and hesitancy. Negative for hematuria and urgency.  All other systems reviewed and are negative.    Observations/Objective: No SOB or distress   Assessment and Plan: 1. UTI symptoms Force fluids AZO over the counter X2 days RTO if symptoms worsen or do not improve  - ciprofloxacin (CIPRO) 500 MG tablet; Take 1 tablet (500 mg total) by mouth 2 (two) times daily.  Dispense: 14 tablet; Refill: 0     I discussed the assessment and treatment plan with the patient. The patient was provided an opportunity to ask questions and all were answered. The patient agreed with the plan and  demonstrated an understanding of the instructions.   The patient was advised to call back or seek an in-person evaluation if the symptoms worsen or if the condition fails to improve as anticipated.  The above assessment and management plan was discussed with the patient. The patient verbalized understanding of and has agreed to the management plan. Patient is aware to call the clinic if symptoms persist or worsen. Patient is aware when to return to the clinic for a follow-up visit. Patient educated on when it is appropriate to go to the emergency department.   Time call ended:  12:15 pm  I provided 7 minutes of non-face-to-face time during this encounter.    Jannifer Rodney, FNP

## 2019-05-02 ENCOUNTER — Ambulatory Visit (INDEPENDENT_AMBULATORY_CARE_PROVIDER_SITE_OTHER): Payer: BC Managed Care – PPO | Admitting: Family

## 2019-05-02 ENCOUNTER — Other Ambulatory Visit: Payer: Self-pay

## 2019-05-02 ENCOUNTER — Encounter: Payer: Self-pay | Admitting: Family

## 2019-05-02 DIAGNOSIS — J329 Chronic sinusitis, unspecified: Secondary | ICD-10-CM

## 2019-05-02 MED ORDER — AMOXICILLIN-POT CLAVULANATE 875-125 MG PO TABS: 1.0000 | ORAL_TABLET | Freq: Two times a day (BID) | ORAL | 0 refills | Status: DC

## 2019-05-02 NOTE — Progress Notes (Signed)
   Virtual Visit via telephone Note  I connected with Gabrielle [name] on 05/02/19 at 2:34 pm by telephone and verified that I am speaking with the correct person using two identifiers. Gabrielle [name] is currently located at home and granddaughter  is currently with her during visit. The provider, Jannifer Rodney, FNP is located in their office at time of visit.  I discussed the limitations, risks, security and privacy concerns of performing an evaluation and management service by telephone and the availability of in person appointments. I also discussed with the patient that there may be a patient responsible charge related to this service. The patient expressed understanding and agreed to proceed.   History and Present Illness:  Sinusitis  This is a new problem. The current episode started 1 to 4 weeks ago. The problem has been gradually worsening since onset. There has been no fever. Associated symptoms include congestion, ear pain, headaches, a hoarse voice, sinus pressure, sneezing and a sore throat. Pertinent negatives include no coughing. Past treatments include acetaminophen. The treatment provided mild relief.      Review of Systems  HENT: Positive for congestion, ear pain, hoarse voice, sinus pressure, sneezing and sore throat.   Respiratory: Negative for cough.   Neurological: Positive for headaches.  All other systems reviewed and are negative.    Observations/Objective: No SOB or distress   Assessment and Plan: 1. Sinusitis, unspecified chronicity, unspecified location - Take meds as prescribed - Use a cool mist humidifier  -Use saline nose sprays frequently -Force fluids -For any cough or congestion  Use plain Mucinex- regular strength or max strength is fine -For fever or aces or pains- take tylenol or ibuprofen. -Throat lozenges if help -RTO if symptoms worsen or do not improve  - amoxicillin-clavulanate (AUGMENTIN) 875-125 MG tablet; Take 1 tablet by mouth 2 (two)  times daily.  Dispense: 14 tablet; Refill: 0     I discussed the assessment and treatment plan with the patient. The patient was provided an opportunity to ask questions and all were answered. The patient agreed with the plan and demonstrated an understanding of the instructions.   The patient was advised to call back or seek an in-person evaluation if the symptoms worsen or if the condition fails to improve as anticipated.  The above assessment and management plan was discussed with the patient. The patient verbalized understanding of and has agreed to the management plan. Patient is aware to call the clinic if symptoms persist or worsen. Patient is aware when to return to the clinic for a follow-up visit. Patient educated on when it is appropriate to go to the emergency department.   Time call ended:  2:41 pm   I provided 7 minutes of non-face-to-face time during this encounter.    Jannifer Rodney, FNP

## 2019-06-06 ENCOUNTER — Other Ambulatory Visit: Payer: Self-pay | Admitting: Physician Assistant

## 2019-06-06 ENCOUNTER — Telehealth: Payer: Self-pay | Admitting: Physician Assistant

## 2019-06-06 MED ORDER — FLUCONAZOLE 150 MG PO TABS: 150.0000 mg | ORAL_TABLET | Freq: Once | ORAL | 0 refills | Status: AC

## 2019-06-06 NOTE — Telephone Encounter (Signed)
Patient aware.

## 2019-06-06 NOTE — Telephone Encounter (Signed)
Sent diflucan to the pharmacy

## 2019-07-04 ENCOUNTER — Ambulatory Visit: Payer: BLUE CROSS/BLUE SHIELD | Admitting: Physician Assistant

## 2019-07-12 ENCOUNTER — Other Ambulatory Visit: Payer: Self-pay

## 2019-07-13 ENCOUNTER — Other Ambulatory Visit: Payer: Self-pay

## 2019-07-13 ENCOUNTER — Ambulatory Visit: Payer: BC Managed Care – PPO | Admitting: Physician Assistant

## 2019-07-13 ENCOUNTER — Encounter: Payer: Self-pay | Admitting: Physician Assistant

## 2019-07-13 VITALS — BP 128/93 | HR 97 | Temp 97.7°F | Ht 67.0 in | Wt 158.4 lb

## 2019-07-13 DIAGNOSIS — F419 Anxiety disorder, unspecified: Secondary | ICD-10-CM

## 2019-07-13 DIAGNOSIS — M7072 Other bursitis of hip, left hip: Secondary | ICD-10-CM | POA: Diagnosis not present

## 2019-07-13 DIAGNOSIS — M797 Fibromyalgia: Secondary | ICD-10-CM

## 2019-07-13 DIAGNOSIS — F4321 Adjustment disorder with depressed mood: Secondary | ICD-10-CM | POA: Diagnosis not present

## 2019-07-13 MED ORDER — PREDNISONE 10 MG (48) PO TBPK: ORAL_TABLET | ORAL | 0 refills | Status: AC

## 2019-07-13 MED ORDER — ALPRAZOLAM 0.5 MG PO TABS: 0.5000 mg | ORAL_TABLET | Freq: Two times a day (BID) | ORAL | 5 refills | Status: AC | PRN

## 2019-07-13 MED ORDER — GABAPENTIN 400 MG PO CAPS: 800.0000 mg | ORAL_CAPSULE | Freq: Three times a day (TID) | ORAL | 5 refills | Status: AC

## 2019-07-13 MED ORDER — TIZANIDINE HCL 4 MG PO TABS: 4.0000 mg | ORAL_TABLET | Freq: Three times a day (TID) | ORAL | 5 refills | Status: AC | PRN

## 2019-07-13 NOTE — Patient Instructions (Signed)
Hip Bursitis  Hip bursitis is swelling of a fluid-filled sac (bursa) in your hip joint. This swelling (inflammation) can be painful. This condition may come and go over time. What are the causes?  Injury to the hip.  Overuse of the muscles that surround the hip joint.  An earlier injury or surgery of the hip.  Arthritis or gout.  Diabetes.  Thyroid disease.  Infection.  In some cases, the cause may not be known. What are the signs or symptoms?  Mild or moderate pain in the hip area. Pain may get worse with movement.  Tenderness and swelling of the hip, especially on the outer side of the hip.  In rare cases, the bursa may become infected. This may cause: ? A fever. ? Warmth and redness in the area. Symptoms may come and go. How is this treated? This condition is treated by resting, icing, applying pressure (compression), and raising (elevating) the injured area. You may hear this called the RICE treatment. Treatment may also include:  Using crutches.  Draining fluid out of the bursa to help relieve swelling.  Giving a shot of (injecting) medicine that helps to reduce swelling (cortisone).  Other medicines if the bursa is infected. Follow these instructions at home: Managing pain, stiffness, and swelling   If told, put ice on the painful area. ? Put ice in a plastic bag. ? Place a towel between your skin and the bag. ? Leave the ice on for 20 minutes, 2-3 times a day. ? Raise (elevate) your hip above the level of your heart as much as you can without pain. To do this, try putting a pillow under your hips while you lie down. Stop if this causes pain. Activity  Return to your normal activities as told by your doctor. Ask your doctor what activities are safe for you.  Rest and protect your hip as much as you can until you feel better. General instructions  Take over-the-counter and prescription medicines only as told by your doctor.  Wear wraps that put pressure  on your hip (compression wraps) only as told by your doctor.  Do not use your hip to support your body weight until your doctor says that you can.  Use crutches as told by your doctor.  Gently rub and stretch your injured area as often as is comfortable.  Keep all follow-up visits as told by your doctor. This is important. How is this prevented?  Exercise regularly, as told by your doctor.  Warm up and stretch before being active.  Cool down and stretch after being active.  Avoid activities that bother your hip or cause pain.  Avoid sitting down for long periods at a time. Contact a doctor if:  You have a fever.  You get new symptoms.  You have trouble walking.  You have trouble doing everyday activities.  You have pain that gets worse.  You have pain that does not get better with medicine.  You get red skin on your hip area.  You get a feeling of warmth in your hip area. Get help right away if:  You cannot move your hip.  You have very bad pain. Summary  Hip bursitis is swelling of a fluid-filled sac (bursa) in your hip.  Hip bursitis can be painful.  Symptoms often come and go over time.  This condition is treated with rest, ice, compression, elevation, and medicines. This information is not intended to replace advice given to you by your health care provider.   Make sure you discuss any questions you have with your health care provider. Document Released: 12/19/2010 Document Revised: 07/25/2018 Document Reviewed: 07/25/2018 Elsevier Patient Education  2020 Elsevier Inc.  

## 2019-07-14 ENCOUNTER — Telehealth: Payer: Self-pay | Admitting: *Deleted

## 2019-07-14 NOTE — Telephone Encounter (Signed)
Prior Auth for Gabapentin 400mg  casules-APPROVED  Coverage Start Date:07/01/2019;Coverage End Date:07/13/2020;  PA Case ID: [id number]  BLUE CROSS BLUE SHIELD BCBS COMM PPO

## 2019-07-18 ENCOUNTER — Encounter: Payer: Self-pay | Admitting: Physician Assistant

## 2019-07-18 NOTE — Progress Notes (Signed)
BP (!) 128/93   Pulse 97   Temp 97.7 F (36.5 C) (Temporal)   Ht 5\' 7"  (1.702 m)   Wt 158 lb 6.4 oz (71.8 kg)   BMI 24.81 kg/m    Subjective:    Patient ID: Gabrielle [name], female    DOB: [birth year], 43 y.o.   MRN: [id number]  HPI: Gabrielle [name] is a 43 y.o. female presenting on 07/13/2019 for Medical Management of Chronic Issues (6 month follow up )  This patient comes in for 1107-month follow-up on her chronic medical conditions that do include anxiety, depression and mood disorder, fibromyalgia with neuropathy, allergic rhinitis, GERD, episodic bronchitis, tobacco user.   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  She is under a lot of stress related to the COVID-19 changes.  She is trying to cope with.  And getting change.  But overall she does state that she is feeling much better than she has in a long time.  [facility] 07/13/2019 01/02/2019 10/24/2018 03/25/2018 02/11/2018  Decreased Interest 0 1 1 3  0  Down, Depressed, Hopeless 0 2 1 1  0  PHQ - 2 Score 0 3 2 4  0  Altered sleeping - 2 2 3  -  Tired, decreased energy - 2 2 3  -  Change in appetite - 0 0 2 -  Feeling bad or failure about yourself  - 0 0 0 -  Trouble concentrating - 2 2 3  -  Moving slowly or fidgety/restless - 0 0 3 -  Suicidal thoughts - 0 0 0 -  PHQ-9 Score - 9 8 18  -  Difficult doing work/chores - - - - -     Past Medical History:  Diagnosis Date  . Anxiety   . Fibromyalgia   . Fibromyalgia   . Hypertension    Relevant past medical, surgical, family and social history reviewed and updated as indicated. Interim medical history since our last visit reviewed. Allergies and medications reviewed and updated. DATA REVIEWED: CHART IN EPIC  Family History reviewed for pertinent findings.  Review of Systems  Constitutional: Negative.   HENT: Negative.   Eyes: Negative.   Respiratory: Negative.   Gastrointestinal: Negative.   Genitourinary: Negative.     Allergies as of 07/13/2019       Reactions   Morphine And Related    Percocet [oxycodone-acetaminophen] Other (See Comments)   Chest pain       Medication List       Accurate as of July 13, 2019 11:59 PM. If you have any questions, ask your nurse or doctor.        STOP taking these medications   amoxicillin-clavulanate 875-125 MG tablet Commonly known as: AUGMENTIN Stopped by: Remus LofflerAngel S Aram Domzalski, PA-C   benzonatate 100 MG capsule Commonly known as: Agricultural engineerTessalon Perles Stopped by: Remus LofflerAngel S Shuntavia Yerby, PA-C   ciprofloxacin 500 MG tablet Commonly known as: Cipro Stopped by: Remus LofflerAngel S Shatoya Roets, PA-C     TAKE these medications   acetaminophen 500 MG tablet Commonly known as: TYLENOL Take 1,000-1,500 mg by mouth every 6 (six) hours as needed for headache.   albuterol 108 (90 Base) MCG/ACT inhaler Commonly known as: VENTOLIN HFA Inhale 2 puffs into the lungs every 6 (six) hours as needed for wheezing or shortness of breath.   ALPRAZolam 0.5 MG tablet Commonly known as: XANAX Take 1 tablet (0.5 mg total) by mouth 2 (two) times daily as needed. for anxiety   ARIPiprazole 5 MG tablet  Commonly known as: Abilify Take 1 tablet (5 mg total) by mouth daily.   buPROPion 150 MG 12 hr tablet Commonly known as: WELLBUTRIN SR Take 1 tablet (150 mg total) by mouth 2 (two) times daily.   diphenoxylate-atropine 2.5-0.025 MG tablet Commonly known as: LOMOTIL Take 1 tablet by mouth 4 (four) times daily as needed for diarrhea or loose stools.   gabapentin 400 MG capsule Commonly known as: NEURONTIN Take 2 capsules (800 mg total) by mouth 3 (three) times daily. What changed:   medication strength  how much to take Changed by: Terald Sleeper, PA-C   ibuprofen 800 MG tablet Commonly known as: ADVIL Take 1 tablet (800 mg total) by mouth every 8 (eight) hours as needed.   loratadine 10 MG tablet Commonly known as: CLARITIN Take 1 tablet (10 mg total) by mouth daily.   pantoprazole 40 MG tablet Commonly known as: PROTONIX  Take 1 tablet (40 mg total) by mouth daily.   predniSONE 10 MG (48) Tbpk tablet Commonly known as: STERAPRED UNI-PAK 48 TAB Take as directed for 12 days Started by: Terald Sleeper, PA-C   tiZANidine 4 MG tablet Commonly known as: Zanaflex Take 1 tablet (4 mg total) by mouth every 8 (eight) hours as needed for muscle spasms. Started by: Terald Sleeper, PA-C          Objective:    BP (!) 128/93   Pulse 97   Temp 97.7 F (36.5 C) (Temporal)   Ht 5\' 7"  (1.702 m)   Wt 158 lb 6.4 oz (71.8 kg)   BMI 24.81 kg/m   Allergies  Allergen Reactions  . Morphine And Related   . Percocet [Oxycodone-Acetaminophen] Other (See Comments)    Chest pain     Wt Readings from Last 3 Encounters:  07/13/19 158 lb 6.4 oz (71.8 kg)  01/02/19 162 lb 6.4 oz (73.7 kg)  08/09/18 156 lb 9.6 oz (71 kg)    Physical Exam Constitutional:      General: She is not in acute distress.    Appearance: Normal appearance. She is well-developed.  HENT:     Head: Normocephalic and atraumatic.  Cardiovascular:     Rate and Rhythm: Normal rate.  Pulmonary:     Effort: Pulmonary effort is normal.  Skin:    General: Skin is warm and dry.     Findings: No rash.  Neurological:     Mental Status: She is alert and oriented to person, place, and time.     Deep Tendon Reflexes: Reflexes are normal and symmetric.         Assessment & Plan:   1. Fibromyalgia - gabapentin (NEURONTIN) 400 MG capsule; Take 2 capsules (800 mg total) by mouth 3 (three) times daily.  Dispense: 180 capsule; Refill: 5  2. Anxiety - ALPRAZolam (XANAX) 0.5 MG tablet; Take 1 tablet (0.5 mg total) by mouth 2 (two) times daily as needed. for anxiety  Dispense: 60 tablet; Refill: 5  3. Grief reaction - ALPRAZolam (XANAX) 0.5 MG tablet; Take 1 tablet (0.5 mg total) by mouth 2 (two) times daily as needed. for anxiety  Dispense: 60 tablet; Refill: 5  4. Bursitis of left hip, unspecified bursa - predniSONE (STERAPRED UNI-PAK 48 TAB) 10 MG  (48) TBPK tablet; Take as directed for 12 days  Dispense: 48 tablet; Refill: 0   Continue all other maintenance medications as listed above.  Follow up plan: Return in about 2 months (around 09/12/2019).  Educational handout given for  survey  Remus LofflerAngel S. Shey Bartmess PA-C [facility] [address]  [place], [place] [id number] [phone]   07/18/2019, 9:10 PM

## 2019-07-21 ENCOUNTER — Ambulatory Visit (INDEPENDENT_AMBULATORY_CARE_PROVIDER_SITE_OTHER): Payer: BC Managed Care – PPO | Admitting: Physician Assistant

## 2019-07-21 ENCOUNTER — Other Ambulatory Visit: Payer: Self-pay

## 2019-07-21 DIAGNOSIS — L304 Erythema intertrigo: Secondary | ICD-10-CM | POA: Diagnosis not present

## 2019-07-21 DIAGNOSIS — R197 Diarrhea, unspecified: Secondary | ICD-10-CM

## 2019-07-21 DIAGNOSIS — Z20822 Contact with and (suspected) exposure to covid-19: Secondary | ICD-10-CM

## 2019-07-21 DIAGNOSIS — R432 Parageusia: Secondary | ICD-10-CM | POA: Diagnosis not present

## 2019-07-21 MED ORDER — PROAIR RESPICLICK 108 (90 BASE) MCG/ACT IN AEPB: 1.0000 | INHALATION_SPRAY | Freq: Four times a day (QID) | RESPIRATORY_TRACT | 5 refills | Status: DC | PRN

## 2019-07-22 LAB — NOVEL CORONAVIRUS, NAA: SARS-CoV-2, NAA: NOT DETECTED

## 2019-07-23 ENCOUNTER — Encounter: Payer: Self-pay | Admitting: Physician Assistant

## 2019-07-23 NOTE — Progress Notes (Signed)
Telephone visit  Subjective: RW:[id number] illness PCP: Terald Sleeper, PA-C PPJ:[id number] Gabrielle [name] is Gabrielle 43 y.o. female calls for telephone consult today. Patient provides verbal consent for consult held via phone.  Patient is identified with 2 separate identifiers.  At this time the entire area is on COVID-19 social distancing and stay home orders are in place.  Patient is of higher risk and therefore we are performing this by Gabrielle virtual method.  Location of patient: home Location of provider: HOME Others present for call: no  This patient has had 3 days of diarrhea and lost her taste. This patient has had many days of sore throat and postnasal drainage, headache at times and sinus pressure. There is copious drainage at times. Denies any fever at this time. There has been Gabrielle history of sinus infections in the past.  There is cough at night. It has become more prevalent in recent days.    ROS: Per HPI  Allergies  Allergen Reactions  . Morphine And Related   . Percocet [Oxycodone-Acetaminophen] Other (See Comments)    Chest pain    Past Medical History:  Diagnosis Date  . Anxiety   . Depression   . Fibromyalgia   . Fibromyalgia   . Hypertension     Current Outpatient Medications:  .  acetaminophen (TYLENOL) 500 MG tablet, Take 1,000-1,500 mg by mouth every 6 (six) hours as needed for headache., Disp: , Rfl:  .  Albuterol Sulfate (PROAIR RESPICLICK) 124 (90 Base) MCG/ACT AEPB, Inhale 1-2 puffs into the lungs 4 (four) times daily as needed (cough and wheezing). prn, Disp: 1 each, Rfl: 5 .  ALPRAZolam (XANAX) 0.5 MG tablet, Take 1 tablet (0.5 mg total) by mouth 2 (two) times daily as needed. for anxiety, Disp: 60 tablet, Rfl: 5 .  ARIPiprazole (ABILIFY) 5 MG tablet, Take 1 tablet (5 mg total) by mouth daily., Disp: 30 tablet, Rfl: 5 .  buPROPion (WELLBUTRIN SR) 150 MG 12 hr tablet, Take 1 tablet (150 mg total) by mouth 2 (two) times daily., Disp: 60 tablet, Rfl: 11 .   diphenoxylate-atropine (LOMOTIL) 2.5-0.025 MG tablet, Take 1 tablet by mouth 4 (four) times daily as needed for diarrhea or loose stools., Disp: 30 tablet, Rfl: 0 .  gabapentin (NEURONTIN) 400 MG capsule, Take 2 capsules (800 mg total) by mouth 3 (three) times daily., Disp: 180 capsule, Rfl: 5 .  ibuprofen (ADVIL,MOTRIN) 800 MG tablet, Take 1 tablet (800 mg total) by mouth every 8 (eight) hours as needed., Disp: 60 tablet, Rfl: 5 .  loratadine (CLARITIN) 10 MG tablet, Take 1 tablet (10 mg total) by mouth daily., Disp: 30 tablet, Rfl: 11 .  pantoprazole (PROTONIX) 40 MG tablet, Take 1 tablet (40 mg total) by mouth daily., Disp: 30 tablet, Rfl: 11 .  predniSONE (STERAPRED UNI-PAK 48 TAB) 10 MG (48) TBPK tablet, Take as directed for 12 days, Disp: 48 tablet, Rfl: 0 .  tiZANidine (ZANAFLEX) 4 MG tablet, Take 1 tablet (4 mg total) by mouth every 8 (eight) hours as needed for muscle spasms., Disp: 90 tablet, Rfl: 5  Assessment/ Plan: 43 y.o. female   1. Diarrhea, unspecified type - Albuterol Sulfate (PROAIR RESPICLICK) 580 (90 Base) MCG/ACT AEPB; Inhale 1-2 puffs into the lungs 4 (four) times daily as needed (cough and wheezing). prn  Dispense: 1 each; Refill: 5  2. Loss of taste - Albuterol Sulfate (PROAIR RESPICLICK) 998 (90 Base) MCG/ACT AEPB; Inhale 1-2 puffs into the lungs 4 (four) times daily as  needed (cough and wheezing). prn  Dispense: 1 each; Refill: 5  3. Chafing - Albuterol Sulfate (PROAIR RESPICLICK) 108 (90 Base) MCG/ACT AEPB; Inhale 1-2 puffs into the lungs 4 (four) times daily as needed (cough and wheezing). prn  Dispense: 1 each; Refill: 5    No follow-ups on file.  Continue all other maintenance medications as listed above.  Start time: 8:02 AM. End time: 8:16 AM  Meds ordered this encounter  Medications  . Albuterol Sulfate (PROAIR RESPICLICK) 108 (90 Base) MCG/ACT AEPB    Sig: Inhale 1-2 puffs into the lungs 4 (four) times daily as needed (cough and wheezing). prn     Dispense:  1 each    Refill:  5    Patient coming with GoodRx coupon    Order Specific Question:   Supervising Provider    Answer:   Raliegh IpGOTTSCHALK, ASHLY M [[id number]]    Prudy FeelerAngel Ranulfo Kall PA-C [facility] [phone]

## 2019-07-25 ENCOUNTER — Telehealth: Payer: Self-pay | Admitting: Physician Assistant

## 2019-07-25 ENCOUNTER — Other Ambulatory Visit: Payer: Self-pay | Admitting: Physician Assistant

## 2019-07-25 MED ORDER — CIPROFLOXACIN HCL 500 MG PO TABS: 500.0000 mg | ORAL_TABLET | Freq: Two times a day (BID) | ORAL | 0 refills | Status: AC

## 2019-07-25 NOTE — Telephone Encounter (Signed)
cipro sent to [facility]

## 2019-07-25 NOTE — Telephone Encounter (Signed)
Patient went Thursday for Covid testing she still wasn't feeling good. Now when she urinates it has a foul odor and she is hurting in her stomach and back

## 2019-07-25 NOTE — Telephone Encounter (Signed)
Patient aware rx sent to [facility].

## 2019-07-26 ENCOUNTER — Telehealth: Payer: Self-pay | Admitting: *Deleted

## 2019-07-26 NOTE — Telephone Encounter (Signed)
Fax from [facility] for Ciprofloxacin Pt also on Tizanidine Contraindicated together Please advise on changing this or telling pt to hold Tizanidine while on abx

## 2019-07-26 NOTE — Telephone Encounter (Signed)
[facility] aware to hold until abx complete

## 2019-07-26 NOTE — Telephone Encounter (Signed)
Yes, may hold the tizanidine until cipro is complete

## 2019-07-29 ENCOUNTER — Other Ambulatory Visit: Payer: Self-pay | Admitting: Physician Assistant

## 2019-07-31 ENCOUNTER — Encounter: Payer: Self-pay | Admitting: Nurse Practitioner

## 2019-07-31 ENCOUNTER — Ambulatory Visit: Payer: BC Managed Care – PPO | Admitting: Nurse Practitioner

## 2019-07-31 ENCOUNTER — Other Ambulatory Visit: Payer: Self-pay

## 2019-07-31 VITALS — BP 117/85 | HR 77 | Temp 97.3°F | Ht 67.0 in | Wt 162.0 lb

## 2019-07-31 DIAGNOSIS — B373 Candidiasis of vulva and vagina: Secondary | ICD-10-CM | POA: Diagnosis not present

## 2019-07-31 DIAGNOSIS — B3731 Acute candidiasis of vulva and vagina: Secondary | ICD-10-CM

## 2019-07-31 DIAGNOSIS — M549 Dorsalgia, unspecified: Secondary | ICD-10-CM

## 2019-07-31 LAB — MICROSCOPIC EXAMINATION
Bacteria, UA: NONE SEEN
RBC, Urine: NONE SEEN /hpf (ref 0–2)

## 2019-07-31 LAB — URINALYSIS, COMPLETE
Bilirubin, UA: NEGATIVE
Glucose, UA: NEGATIVE
Ketones, UA: NEGATIVE
Leukocytes,UA: NEGATIVE
Nitrite, UA: NEGATIVE
Protein,UA: NEGATIVE
RBC, UA: NEGATIVE
Specific Gravity, UA: 1.02 (ref 1.005–1.030)
Urobilinogen, Ur: 0.2 mg/dL (ref 0.2–1.0)
pH, UA: 7 (ref 5.0–7.5)

## 2019-07-31 NOTE — Progress Notes (Signed)
Subjective:    Patient ID: Gabrielle [name], female    DOB: 1976/05/11, 43 y.o.   MRN: [id number]   Chief Complaint: Groin Swelling (Currently on Cipro and prednisone) and Back Pain   HPI Patient come sin today with 2 complaints: - patient is c/o vaginal swelling and irritation. - mid bil back pain that started 5 days ago. She has been on cipro since Saturday for a UTI. Urine no longer swells but back pain is worse.   Review of Systems  Constitutional: Negative for activity change and appetite change.  HENT: Negative.   Eyes: Negative for pain.  Respiratory: Negative for shortness of breath.   Cardiovascular: Negative for chest pain, palpitations and leg swelling.  Gastrointestinal: Negative for abdominal pain.  Endocrine: Negative for polydipsia.  Genitourinary: Positive for dysuria, frequency, genital sores, vaginal discharge and vaginal pain. Negative for decreased urine volume, pelvic pain and vaginal bleeding.  Musculoskeletal: Positive for back pain (mid bil).  Skin: Negative for rash.  Neurological: Negative for dizziness, weakness and headaches.  Hematological: Does not bruise/bleed easily.  Psychiatric/Behavioral: Negative.   All other systems reviewed and are negative.      Objective:   Physical Exam Vitals signs and nursing note reviewed.  Constitutional:      General: She is not in acute distress.    Appearance: Normal appearance. She is well-developed.  HENT:     Head: Normocephalic.     Nose: Nose normal.  Eyes:     Pupils: Pupils are equal, round, and reactive to light.  Neck:     Musculoskeletal: Normal range of motion and neck supple.     Vascular: No carotid bruit or JVD.  Cardiovascular:     Rate and Rhythm: Normal rate and regular rhythm.     Heart sounds: Normal heart sounds.  Pulmonary:     Effort: Pulmonary effort is normal. No respiratory distress.     Breath sounds: Normal breath sounds. No wheezing or rales.  Chest:     Chest wall: No  tenderness.  Abdominal:     General: Bowel sounds are normal. There is no distension or abdominal bruit.     Palpations: Abdomen is soft. There is no hepatomegaly, splenomegaly, mass or pulsatile mass.     Tenderness: There is no abdominal tenderness. There is left CVA tenderness. There is no right CVA tenderness.  Genitourinary:    Comments: External gentalia erythematous and swollen with slight vaginal swelling. Thick cheesey appearing discharge. Musculoskeletal: Normal range of motion.  Lymphadenopathy:     Cervical: No cervical adenopathy.  Skin:    General: Skin is warm and dry.  Neurological:     Mental Status: She is alert and oriented to person, place, and time.     Deep Tendon Reflexes: Reflexes are normal and symmetric.  Psychiatric:        Behavior: Behavior normal.        Thought Content: Thought content normal.        Judgment: Judgment normal.     BP 117/85   Pulse 77   Temp (!) 97.3 F (36.3 C) (Oral)   Ht 5\' 7"  (1.702 m)   Wt 162 lb (73.5 kg)   BMI 25.37 kg/m   Urine clear      Assessment & Plan:  Gabrielle [name] in today with chief complaint of Groin Swelling (Currently on Cipro and prednisone) and Back Pain   1. Vaginal candidiasis Take diflucan rx by A. Jones,PA No bubble baths  2. Mid back pain Moist heat Rest Should improve wihen can go back on zanaflex - Urinalysis, Complete  Mary-Margaret Daphine DeutscherMartin, FNP

## 2019-07-31 NOTE — Patient Instructions (Signed)
Acute Back Pain, Adult Acute back pain is sudden and usually short-lived. It is often caused by an injury to the muscles and tissues in the back. The injury may result from:  A muscle or ligament getting overstretched or torn (strained). Ligaments are tissues that connect bones to each other. Lifting something improperly can cause a back strain.  Wear and tear (degeneration) of the spinal disks. Spinal disks are circular tissue that provides cushioning between the bones of the spine (vertebrae).  Twisting motions, such as while playing sports or doing yard work.  A hit to the back.  Arthritis. You may have a physical exam, lab tests, and imaging tests to find the cause of your pain. Acute back pain usually goes away with rest and home care. Follow these instructions at home: Managing pain, stiffness, and swelling  Take over-the-counter and prescription medicines only as told by your health care provider.  Your health care provider may recommend applying ice during the first 24-48 hours after your pain starts. To do this: ? Put ice in a plastic bag. ? Place a towel between your skin and the bag. ? Leave the ice on for 20 minutes, 2-3 times a day.  If directed, apply heat to the affected area as often as told by your health care provider. Use the heat source that your health care provider recommends, such as a moist heat pack or a heating pad. ? Place a towel between your skin and the heat source. ? Leave the heat on for 20-30 minutes. ? Remove the heat if your skin turns bright red. This is especially important if you are unable to feel pain, heat, or cold. You have a greater risk of getting burned. Activity   Do not stay in bed. Staying in bed for more than 1-2 days can delay your recovery.  Sit up and stand up straight. Avoid leaning forward when you sit, or hunching over when you stand. ? If you work at a desk, sit close to it so you do not need to lean over. Keep your chin tucked  in. Keep your neck drawn back, and keep your elbows bent at a right angle. Your arms should look like the letter "L." ? Sit high and close to the steering wheel when you drive. Add lower back (lumbar) support to your car seat, if needed.  Take short walks on even surfaces as soon as you are able. Try to increase the length of time you walk each day.  Do not sit, drive, or stand in one place for more than 30 minutes at a time. Sitting or standing for long periods of time can put stress on your back.  Do not drive or use heavy machinery while taking prescription pain medicine.  Use proper lifting techniques. When you bend and lift, use positions that put less stress on your back: ? Bend your knees. ? Keep the load close to your body. ? Avoid twisting.  Exercise regularly as told by your health care provider. Exercising helps your back heal faster and helps prevent back injuries by keeping muscles strong and flexible.  Work with a physical therapist to make a safe exercise program, as recommended by your health care provider. Do any exercises as told by your physical therapist. Lifestyle  Maintain a healthy weight. Extra weight puts stress on your back and makes it difficult to have good posture.  Avoid activities or situations that make you feel anxious or stressed. Stress and anxiety increase muscle   tension and can make back pain worse. Learn ways to manage anxiety and stress, such as through exercise. General instructions  Sleep on a firm mattress in a comfortable position. Try lying on your side with your knees slightly bent. If you lie on your back, put a pillow under your knees.  Follow your treatment plan as told by your health care provider. This may include: ? Cognitive or behavioral therapy. ? Acupuncture or massage therapy. ? Meditation or yoga. Contact a health care provider if:  You have pain that is not relieved with rest or medicine.  You have increasing pain going down  into your legs or buttocks.  Your pain does not improve after 2 weeks.  You have pain at night.  You lose weight without trying.  You have a fever or chills. Get help right away if:  You develop new bowel or bladder control problems.  You have unusual weakness or numbness in your arms or legs.  You develop nausea or vomiting.  You develop abdominal pain.  You feel faint. Summary  Acute back pain is sudden and usually short-lived.  Use proper lifting techniques. When you bend and lift, use positions that put less stress on your back.  Take over-the-counter and prescription medicines and apply heat or ice as directed by your health care provider. This information is not intended to replace advice given to you by your health care provider. Make sure you discuss any questions you have with your health care provider. Document Released: 11/16/2005 Document Revised: 03/07/2019 Document Reviewed: 06/30/2017 Elsevier Patient Education  2020 Elsevier Inc.  

## 2019-08-03 IMAGING — DX DG ANKLE COMPLETE 3+V*R*
3 series · 3 of 3 positions shown · non-contrast
Comparison: None.

CLINICAL DATA: Missed step yesterday with right ankle pain now

EXAM:
RIGHT ANKLE - COMPLETE 3+ VIEW

[ankle ap]
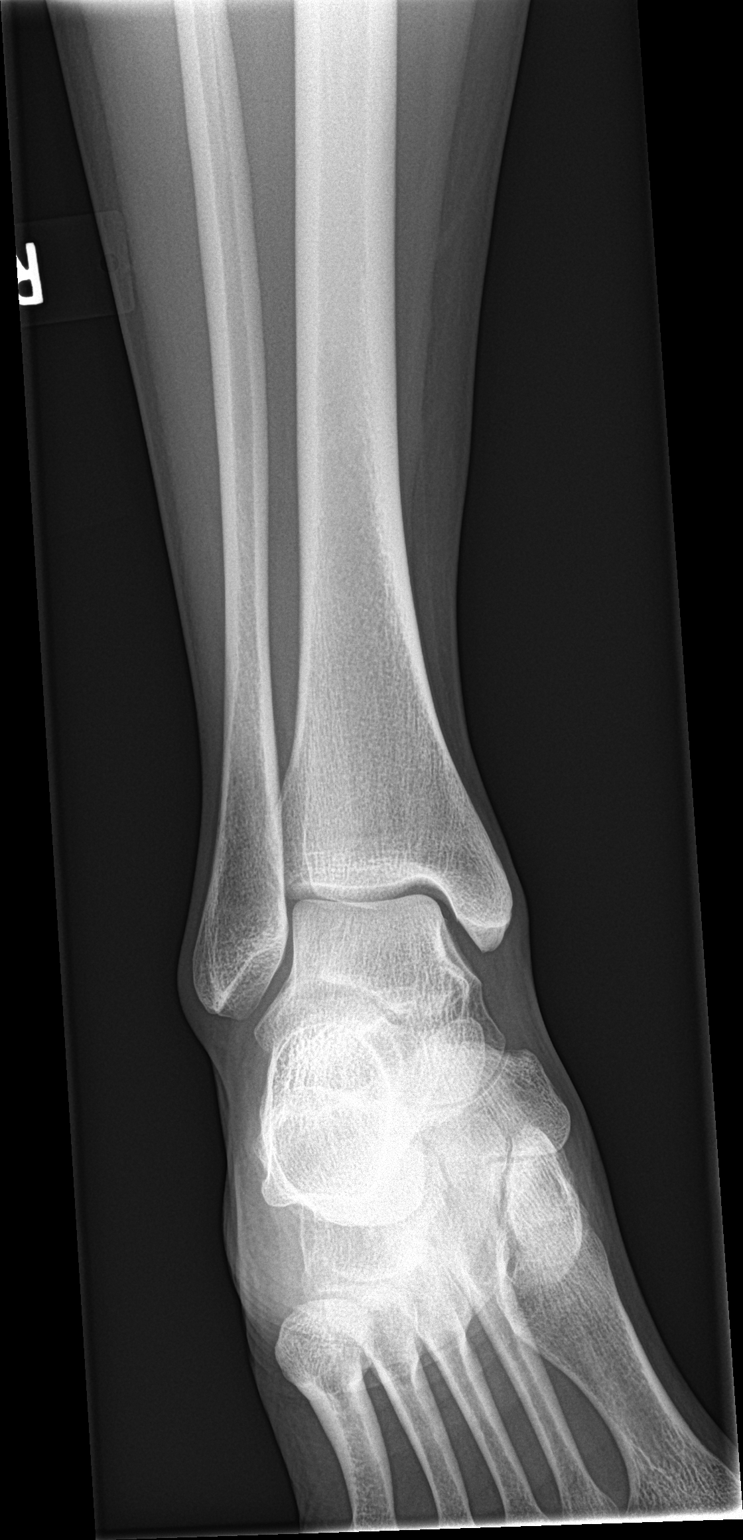

[ankle obl]
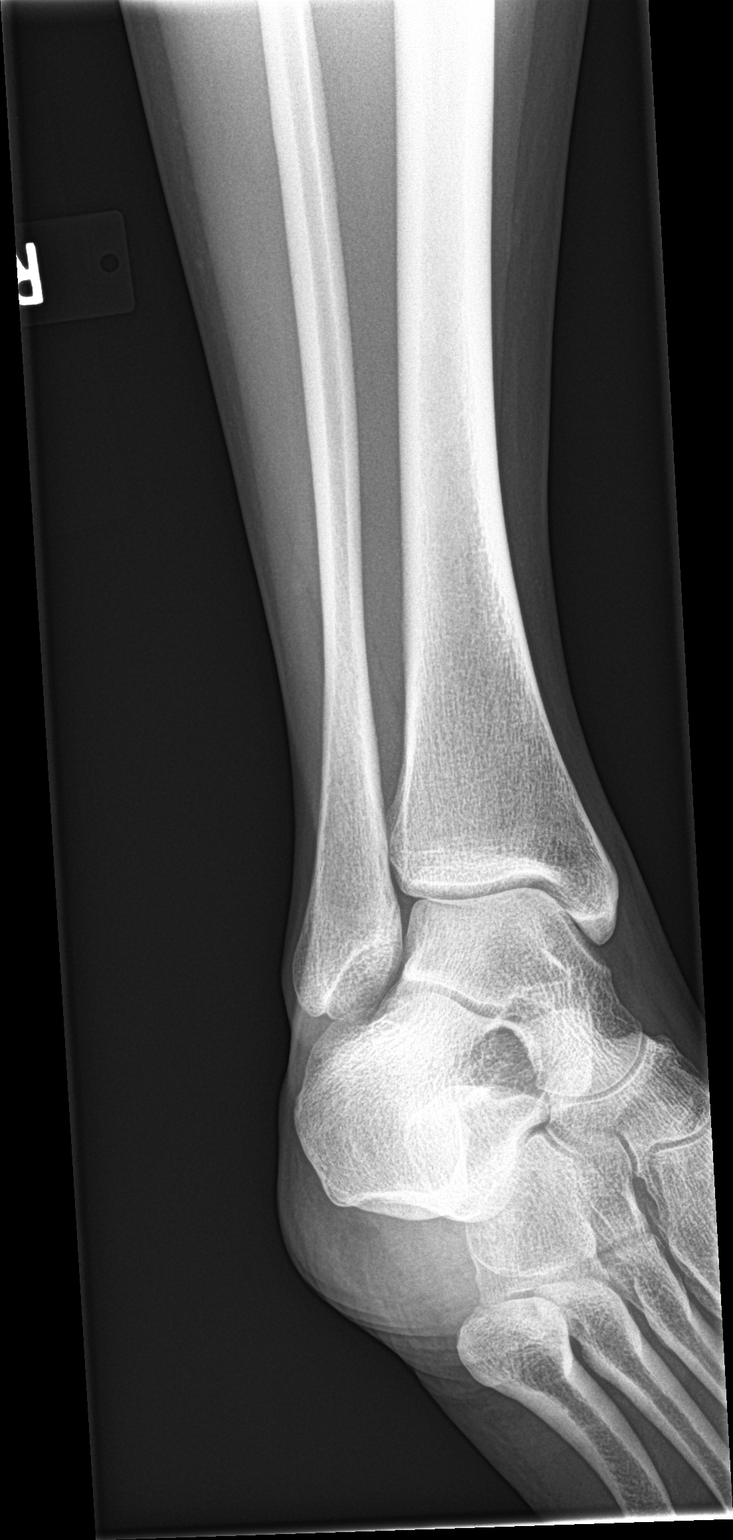

[ankle lat]
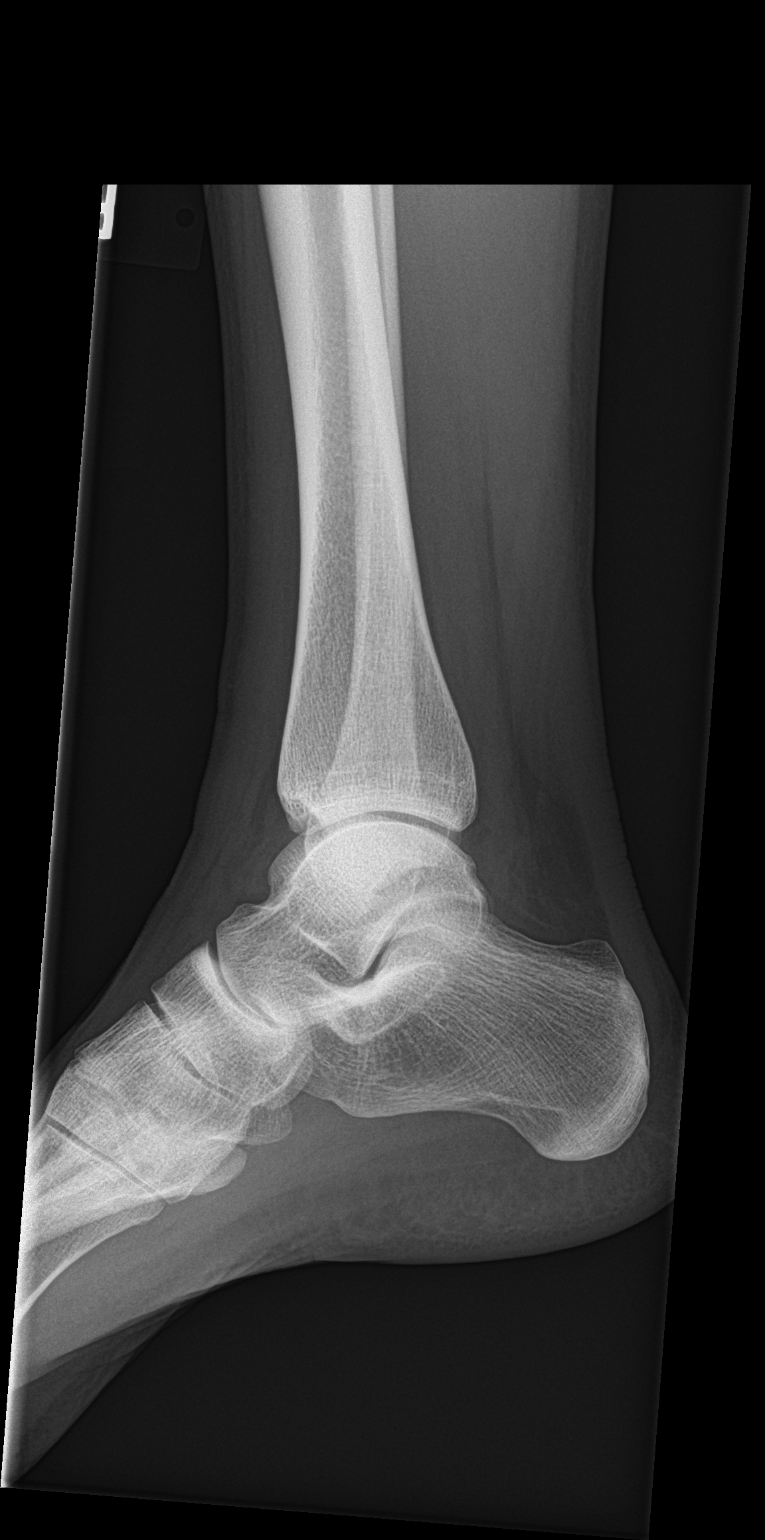

[3 of 3 positions shown; findings below may reference images not displayed]

FINDINGS: The right tibia and fibula appear intact. The ankle joint appears
normal. No fracture is seen. Alignment is normal.
IMPRESSION: Negative.

## 2019-09-04 ENCOUNTER — Encounter: Payer: Self-pay | Admitting: Family

## 2019-09-04 ENCOUNTER — Ambulatory Visit (INDEPENDENT_AMBULATORY_CARE_PROVIDER_SITE_OTHER): Payer: BC Managed Care – PPO | Admitting: Family

## 2019-09-04 ENCOUNTER — Other Ambulatory Visit: Payer: Self-pay

## 2019-09-04 DIAGNOSIS — F17200 Nicotine dependence, unspecified, uncomplicated: Secondary | ICD-10-CM | POA: Diagnosis not present

## 2019-09-04 DIAGNOSIS — Z20828 Contact with and (suspected) exposure to other viral communicable diseases: Secondary | ICD-10-CM

## 2019-09-04 DIAGNOSIS — Z20822 Contact with and (suspected) exposure to covid-19: Secondary | ICD-10-CM

## 2019-09-04 MED ORDER — ALBUTEROL SULFATE HFA 108 (90 BASE) MCG/ACT IN AERS: 2.0000 | INHALATION_SPRAY | Freq: Four times a day (QID) | RESPIRATORY_TRACT | 0 refills | Status: AC | PRN

## 2019-09-04 NOTE — Progress Notes (Signed)
Virtual Visit via telephone Note Due to COVID-19 pandemic this visit was conducted virtually. This visit type was conducted due to national recommendations for restrictions regarding the COVID-19 Pandemic (e.g. social distancing, sheltering in place) in an effort to limit this patient's exposure and mitigate transmission in our community. All issues noted in this document were discussed and addressed.  A physical exam was not performed with this format.  I connected with Gabrielle [name] on 09/04/19 at 1:03 pm by telephone and verified that I am speaking with the correct person using two identifiers. Gabrielle [name] is currently located at home and no one is currently with her during visit. The provider, Evelina Dun, FNP is located in their office at time of visit.  I discussed the limitations, risks, security and privacy concerns of performing an evaluation and management service by telephone and the availability of in person appointments. I also discussed with the patient that there may be a patient responsible charge related to this service. The patient expressed understanding and agreed to proceed.   History and Present Illness:  Pt calls the office today with diarrhea and cough that started 3 days ago. She reports she just got back from the beach with some friends and found out when she was at the beach her friend's dad had [place]. Diarrhea  This is a new problem. The problem occurs 5 to 10 times per day. The problem has been waxing and waning. The stool consistency is described as watery. Associated symptoms include chills, coughing, a fever and myalgias.  Cough This is a new problem. The current episode started in the past 7 days (3 days). The problem has been waxing and waning. The problem occurs every few minutes. The cough is non-productive. Associated symptoms include chills, ear pain, a fever, myalgias, nasal congestion, postnasal drip and a sore throat. Pertinent negatives include no ear  congestion, shortness of breath or wheezing. Risk factors for lung disease include smoking/tobacco exposure. She has tried rest for the symptoms. The treatment provided mild relief.      Review of Systems  Constitutional: Positive for chills and fever.  HENT: Positive for ear pain, postnasal drip and sore throat.   Respiratory: Positive for cough. Negative for shortness of breath and wheezing.   Gastrointestinal: Positive for diarrhea.  Musculoskeletal: Positive for myalgias.  All other systems reviewed and are negative.    Observations/Objective: No SOB or distress noted, hoarse voice  Assessment and Plan: 1. Suspected COVID-19 virus infection - MyChart COVID-19 home monitoring program; Future - albuterol (VENTOLIN HFA) 108 (90 Base) MCG/ACT inhaler; Inhale 2 puffs into the lungs every 6 (six) hours as needed for wheezing or shortness of breath.  Dispense: 8 g; Refill: 0  2. Current smoker  Pt will go get COVID tested today Self isolate Rest Force fluids, s/s of dehydration discussed Tylenol as needed Call office if symptom worsen or do not improve       I discussed the assessment and treatment plan with the patient. The patient was provided an opportunity to ask questions and all were answered. The patient agreed with the plan and demonstrated an understanding of the instructions.   The patient was advised to call back or seek an in-person evaluation if the symptoms worsen or if the condition fails to improve as anticipated.  The above assessment and management plan was discussed with the patient. The patient verbalized understanding of and has agreed to the management plan. Patient is aware to call the  clinic if symptoms persist or worsen. Patient is aware when to return to the clinic for a follow-up visit. Patient educated on when it is appropriate to go to the emergency department.   Time call ended:  1:17 pm   I provided 14 minutes of non-face-to-face time during this  encounter.    Jannifer Rodney, FNP

## 2019-09-05 ENCOUNTER — Other Ambulatory Visit: Payer: Self-pay | Admitting: *Deleted

## 2019-09-05 ENCOUNTER — Encounter (INDEPENDENT_AMBULATORY_CARE_PROVIDER_SITE_OTHER): Payer: Self-pay

## 2019-09-05 DIAGNOSIS — Z20822 Contact with and (suspected) exposure to covid-19: Secondary | ICD-10-CM

## 2019-09-06 ENCOUNTER — Encounter (INDEPENDENT_AMBULATORY_CARE_PROVIDER_SITE_OTHER): Payer: Self-pay

## 2019-09-07 ENCOUNTER — Encounter (INDEPENDENT_AMBULATORY_CARE_PROVIDER_SITE_OTHER): Payer: Self-pay

## 2019-09-07 LAB — NOVEL CORONAVIRUS, NAA: SARS-CoV-2, NAA: NOT DETECTED

## 2019-09-08 ENCOUNTER — Telehealth: Payer: Self-pay | Admitting: Physician Assistant

## 2019-09-08 NOTE — Telephone Encounter (Signed)
Ok to return if has been asymptomatic for THREE consecutive days without medications.

## 2019-09-08 NOTE — Telephone Encounter (Signed)
Patient is 10 days from onset, no fever in 3 days with no medications, and per patient improving.  Letter sent trough MyChart as patient states that she is unable to come pick up and hard copy is not needed. MPulliam, CMA/RT(R)

## 2019-09-08 NOTE — Telephone Encounter (Signed)
Spoke to patient she needs a note stating that she can return to work tomorrow. She states that her fever broke on 09/05/19 ( 3 days ago ) she is still having some productive cough and congestion. Patient is a smoke. Denies any SOB. Per patient note for work has to state that she was tested for COVID and results were negative. This is a patient of Particia Nearing, [place].   Please advise. MPulliam, CMA/RT(R)

## 2019-09-11 ENCOUNTER — Other Ambulatory Visit: Payer: Self-pay | Admitting: Physician Assistant

## 2019-09-11 DIAGNOSIS — M797 Fibromyalgia: Secondary | ICD-10-CM

## 2019-09-15 ENCOUNTER — Ambulatory Visit: Payer: BC Managed Care – PPO | Admitting: Physician Assistant

## 2019-09-23 ENCOUNTER — Other Ambulatory Visit: Payer: Self-pay | Admitting: Physician Assistant

## 2019-09-23 MED ORDER — CLINDAMYCIN PHOSPHATE 1 % EX GEL: Freq: Two times a day (BID) | CUTANEOUS | 0 refills | Status: AC

## 2019-09-23 MED ORDER — FLUCONAZOLE 150 MG PO TABS: ORAL_TABLET | ORAL | 0 refills | Status: AC

## 2019-10-12 ENCOUNTER — Other Ambulatory Visit: Payer: Self-pay | Admitting: Physician Assistant

## 2019-10-12 DIAGNOSIS — M797 Fibromyalgia: Secondary | ICD-10-CM

## 2019-10-31 ENCOUNTER — Ambulatory Visit (INDEPENDENT_AMBULATORY_CARE_PROVIDER_SITE_OTHER): Payer: BC Managed Care – PPO | Admitting: Family Medicine

## 2019-10-31 ENCOUNTER — Other Ambulatory Visit: Payer: Self-pay

## 2019-10-31 DIAGNOSIS — Z20822 Contact with and (suspected) exposure to covid-19: Secondary | ICD-10-CM

## 2019-10-31 DIAGNOSIS — Z5329 Procedure and treatment not carried out because of patient's decision for other reasons: Secondary | ICD-10-CM

## 2019-10-31 NOTE — Progress Notes (Signed)
Telephone visit  Called 8:12am (mailbox full); 9:39am (no answer); 9:47am (mailbox full)

## 2019-11-02 LAB — NOVEL CORONAVIRUS, NAA: SARS-CoV-2, NAA: NOT DETECTED

## 2019-11-27 ENCOUNTER — Other Ambulatory Visit: Payer: Self-pay | Admitting: Physician Assistant

## 2019-11-27 DIAGNOSIS — M797 Fibromyalgia: Secondary | ICD-10-CM

## 2019-12-28 ENCOUNTER — Encounter: Payer: Self-pay | Admitting: Family

## 2019-12-28 ENCOUNTER — Ambulatory Visit (INDEPENDENT_AMBULATORY_CARE_PROVIDER_SITE_OTHER): Payer: BC Managed Care – PPO | Admitting: Family

## 2019-12-28 ENCOUNTER — Other Ambulatory Visit: Payer: Self-pay

## 2019-12-28 ENCOUNTER — Emergency Department (HOSPITAL_COMMUNITY)
Admission: EM | Admit: 2019-12-28 | Discharge: 2019-12-28 | Disposition: A | Discharge status: Home / Self Care | Payer: BC Managed Care – PPO

## 2019-12-28 ENCOUNTER — Ambulatory Visit (HOSPITAL_COMMUNITY)
Admission: RE | Admit: 2019-12-28 | Discharge: 2019-12-28 | Disposition: A | Discharge status: Home / Self Care | Payer: BC Managed Care – PPO | Source: Ambulatory Visit | Attending: Family | Admitting: Family

## 2019-12-28 ENCOUNTER — Other Ambulatory Visit: Payer: Self-pay | Admitting: Family

## 2019-12-28 DIAGNOSIS — R109 Unspecified abdominal pain: Secondary | ICD-10-CM

## 2019-12-28 DIAGNOSIS — Z87442 Personal history of urinary calculi: Secondary | ICD-10-CM

## 2019-12-28 DIAGNOSIS — R1032 Left lower quadrant pain: Secondary | ICD-10-CM

## 2019-12-28 DIAGNOSIS — N200 Calculus of kidney: Secondary | ICD-10-CM

## 2019-12-28 MED ORDER — TAMSULOSIN HCL 0.4 MG PO CAPS: 0.4000 mg | ORAL_CAPSULE | Freq: Every day | ORAL | 3 refills | Status: AC

## 2019-12-28 NOTE — Progress Notes (Signed)
Virtual Visit via telephone Note Due to COVID-19 pandemic this visit was conducted virtually. This visit type was conducted due to national recommendations for restrictions regarding the COVID-19 Pandemic (e.g. social distancing, sheltering in place) in an effort to limit this patient's exposure and mitigate transmission in our community. All issues noted in this document were discussed and addressed.  A physical exam was not performed with this format.  I connected with Gabrielle [name] on 12/28/19 at 10:25 AM  by telephone and verified that I am speaking with the correct person using two identifiers. Gabrielle [name] is currently located at home and husband is currently with her during visit. The provider, Jannifer Rodney, FNP is located in their office at time of visit.  I discussed the limitations, risks, security and privacy concerns of performing an evaluation and management service by telephone and the availability of in person appointments. I also discussed with the patient that there may be a patient responsible charge related to this service. The patient expressed understanding and agreed to proceed.   History and Present Illness:  Pt calls the office today with LL abdominal pain and back pain that is a 10 out 10. She reports she went to the ED yesterday and sat and waited for 4 hours and states she could not wait any longer because of her pain. She has taken tylenol and motrin with no relief. She reports her urine is dark and has had a hx of a kidney stone but it was 27 years ago. Denies any dysuria, fever, or urinary frequency.   She does not have a gallbladder, appendix, uterus, or right ovary.  Back Pain This is a new problem. The pain is at a severity of 10/10. The pain is moderate. Associated symptoms include abdominal pain. Pertinent negatives include no dysuria.  Abdominal Pain This is a new problem. The current episode started in the past 7 days. The onset quality is sudden. The  problem occurs constantly. The problem has been gradually worsening. The pain is located in the LLQ. The pain is at a severity of 10/10. The pain is moderate. The quality of the pain is sharp. Pain radiation: back. Associated symptoms include diarrhea, hematuria, nausea and vomiting. Pertinent negatives include no constipation, dysuria or frequency. Nothing aggravates the pain. The treatment provided mild relief.      Review of Systems  Gastrointestinal: Positive for abdominal pain, diarrhea, nausea and vomiting. Negative for constipation.  Genitourinary: Positive for hematuria. Negative for dysuria and frequency.  Musculoskeletal: Positive for back pain.     Observations/Objective: No SOB, pt crying and very upset. She states her pain is a 10.   Assessment and Plan: 1. Left lower quadrant abdominal pain - CT RENAL STONE STUDY; Future  2. Flank pain - CT RENAL STONE STUDY; Future  3. History of kidney stones  Will order stat CT, I believe this is a stone causing her pain. She has hx of appendectomy, cholecystectomy, and hysterectomy.  Force fluids  If positive will start Flomax and do referral to Urologists If negative will need to send to ED for further work-up given her pain.       I discussed the assessment and treatment plan with the patient. The patient was provided an opportunity to ask questions and all were answered. The patient agreed with the plan and demonstrated an understanding of the instructions.   The patient was advised to call back or seek an in-person evaluation if the symptoms worsen or if  the condition fails to improve as anticipated.  The above assessment and management plan was discussed with the patient. The patient verbalized understanding of and has agreed to the management plan. Patient is aware to call the clinic if symptoms persist or worsen. Patient is aware when to return to the clinic for a follow-up visit. Patient educated on when it is appropriate  to go to the emergency department.   Time call ended:  10:40 Am  I provided 15 minutes of non-face-to-face time during this encounter.    Evelina Dun, FNP

## 2019-12-29 ENCOUNTER — Telehealth: Payer: Self-pay | Admitting: Physician Assistant

## 2019-12-29 MED ORDER — TRAMADOL HCL 50 MG PO TABS: 50.0000 mg | ORAL_TABLET | Freq: Two times a day (BID) | ORAL | 0 refills | Status: AC | PRN

## 2019-12-29 NOTE — Telephone Encounter (Signed)
Patient aware and verbalized understanding. °

## 2019-12-29 NOTE — Telephone Encounter (Signed)
Ultram Prescription sent to pharmacy. Can only give 5 days worth. Force fluids, and take flomax daily. If pain continues needs to be seen.

## 2020-01-04 ENCOUNTER — Other Ambulatory Visit: Payer: Self-pay

## 2020-01-04 ENCOUNTER — Ambulatory Visit (HOSPITAL_COMMUNITY)
Admission: RE | Admit: 2020-01-04 | Discharge: 2020-01-04 | Disposition: A | Discharge status: Home / Self Care | Payer: BC Managed Care – PPO | Source: Ambulatory Visit | Attending: Family | Admitting: Family

## 2020-01-04 DIAGNOSIS — R1032 Left lower quadrant pain: Secondary | ICD-10-CM | POA: Diagnosis not present

## 2020-01-04 DIAGNOSIS — R109 Unspecified abdominal pain: Secondary | ICD-10-CM

## 2020-01-05 ENCOUNTER — Ambulatory Visit (HOSPITAL_COMMUNITY): Payer: BC Managed Care – PPO

## 2020-01-05 ENCOUNTER — Other Ambulatory Visit: Payer: Self-pay | Admitting: Physician Assistant

## 2020-01-05 ENCOUNTER — Telehealth: Payer: Self-pay | Admitting: Physician Assistant

## 2020-01-05 ENCOUNTER — Other Ambulatory Visit: Payer: Self-pay | Admitting: Family

## 2020-01-05 DIAGNOSIS — F419 Anxiety disorder, unspecified: Secondary | ICD-10-CM

## 2020-01-05 DIAGNOSIS — F4321 Adjustment disorder with depressed mood: Secondary | ICD-10-CM

## 2020-01-05 DIAGNOSIS — R1900 Intra-abdominal and pelvic swelling, mass and lump, unspecified site: Secondary | ICD-10-CM

## 2020-01-05 MED ORDER — HYDROCODONE-ACETAMINOPHEN 7.5-325 MG PO TABS: 1.0000 | ORAL_TABLET | Freq: Four times a day (QID) | ORAL | 0 refills | Status: AC | PRN

## 2020-01-05 NOTE — Telephone Encounter (Signed)
Please review and advise.

## 2020-01-05 NOTE — Telephone Encounter (Signed)
Please write details for patient to understand on [place] results. Thanks

## 2020-01-05 NOTE — Telephone Encounter (Signed)
Neysa Bonito is seeing her for this

## 2020-01-09 NOTE — Telephone Encounter (Signed)
Called patient and discussed results

## 2020-01-12 ENCOUNTER — Telehealth: Payer: Self-pay | Admitting: Family

## 2020-01-12 DIAGNOSIS — N200 Calculus of kidney: Secondary | ICD-10-CM

## 2020-01-12 NOTE — Telephone Encounter (Signed)
I have placed a referral to Urologists. I believe a lot of her pain is coming the pelvic mass, not kidney stones. Keep appointments with surgeon.

## 2020-01-12 NOTE — Telephone Encounter (Signed)
Patient aware.

## 2020-01-12 NOTE — Telephone Encounter (Signed)
Patient is requesting that you order a referral to a urologist after her CT scan on 12/28/19 showed kidney stone.  She is still having pain.

## 2020-03-28 ENCOUNTER — Ambulatory Visit: Payer: BC Managed Care – PPO | Admitting: Family Medicine

## 2020-04-03 ENCOUNTER — Ambulatory Visit: Payer: BC Managed Care – PPO | Admitting: Family Medicine

## 2020-04-09 ENCOUNTER — Encounter: Payer: Self-pay | Admitting: Physician Assistant

## 2020-09-05 ENCOUNTER — Other Ambulatory Visit (HOSPITAL_COMMUNITY): Payer: Self-pay | Admitting: Orthopedic Surgery

## 2020-09-05 ENCOUNTER — Telehealth (HOSPITAL_COMMUNITY): Payer: Self-pay | Admitting: Radiology

## 2020-09-05 DIAGNOSIS — M79644 Pain in right finger(s): Secondary | ICD-10-CM

## 2020-09-05 NOTE — Telephone Encounter (Signed)
Tried to call pt to schedule RUE angiogram. No answer and VM is full. JM

## 2020-09-10 ENCOUNTER — Other Ambulatory Visit: Payer: Self-pay | Admitting: Radiology

## 2020-09-12 ENCOUNTER — Other Ambulatory Visit: Payer: Self-pay

## 2020-09-12 ENCOUNTER — Ambulatory Visit (HOSPITAL_COMMUNITY)
Admission: RE | Admit: 2020-09-12 | Discharge: 2020-09-12 | Disposition: A | Discharge status: Home / Self Care | Payer: BC Managed Care – PPO | Source: Ambulatory Visit | Attending: Orthopedic Surgery | Admitting: Orthopedic Surgery

## 2020-09-12 ENCOUNTER — Other Ambulatory Visit (HOSPITAL_COMMUNITY): Payer: Self-pay | Admitting: Orthopedic Surgery

## 2020-09-12 ENCOUNTER — Encounter (HOSPITAL_COMMUNITY): Payer: Self-pay

## 2020-09-12 DIAGNOSIS — F329 Major depressive disorder, single episode, unspecified: Secondary | ICD-10-CM | POA: Diagnosis not present

## 2020-09-12 DIAGNOSIS — M79644 Pain in right finger(s): Secondary | ICD-10-CM

## 2020-09-12 DIAGNOSIS — Z79899 Other long term (current) drug therapy: Secondary | ICD-10-CM | POA: Diagnosis not present

## 2020-09-12 DIAGNOSIS — I10 Essential (primary) hypertension: Secondary | ICD-10-CM | POA: Insufficient documentation

## 2020-09-12 DIAGNOSIS — F419 Anxiety disorder, unspecified: Secondary | ICD-10-CM | POA: Insufficient documentation

## 2020-09-12 DIAGNOSIS — M797 Fibromyalgia: Secondary | ICD-10-CM | POA: Diagnosis not present

## 2020-09-12 DIAGNOSIS — F17210 Nicotine dependence, cigarettes, uncomplicated: Secondary | ICD-10-CM | POA: Insufficient documentation

## 2020-09-12 DIAGNOSIS — Z885 Allergy status to narcotic agent status: Secondary | ICD-10-CM | POA: Insufficient documentation

## 2020-09-12 HISTORY — PX: IR ANGIOGRAM EXTREMITY RIGHT: IMG652

## 2020-09-12 HISTORY — PX: IR US GUIDE VASC ACCESS RIGHT: IMG2390

## 2020-09-12 LAB — CBC WITH DIFFERENTIAL/PLATELET
Abs Immature Granulocytes: 0.03 10*3/uL (ref 0.00–0.07)
Basophils Absolute: 0.1 10*3/uL (ref 0.0–0.1)
Basophils Relative: 1 %
Eosinophils Absolute: 0.7 10*3/uL — ABNORMAL HIGH (ref 0.0–0.5)
Eosinophils Relative: 7 %
HCT: 40 % (ref 36.0–46.0)
Hemoglobin: 13 g/dL (ref 12.0–15.0)
Immature Granulocytes: 0 %
Lymphocytes Relative: 31 %
Lymphs Abs: 3 10*3/uL (ref 0.7–4.0)
MCH: 27.6 pg (ref 26.0–34.0)
MCHC: 32.5 g/dL (ref 30.0–36.0)
MCV: 84.9 fL (ref 80.0–100.0)
Monocytes Absolute: 0.8 10*3/uL (ref 0.1–1.0)
Monocytes Relative: 8 %
Neutro Abs: 5.1 10*3/uL (ref 1.7–7.7)
Neutrophils Relative %: 53 %
Platelets: 207 10*3/uL (ref 150–400)
RBC: 4.71 MIL/uL (ref 3.87–5.11)
RDW: 13.7 % (ref 11.5–15.5)
WBC: 9.6 10*3/uL (ref 4.0–10.5)
nRBC: 0 % (ref 0.0–0.2)

## 2020-09-12 LAB — BASIC METABOLIC PANEL
Anion gap: 11 (ref 5–15)
BUN: 16 mg/dL (ref 6–20)
CO2: 22 mmol/L (ref 22–32)
Calcium: 9.3 mg/dL (ref 8.9–10.3)
Chloride: 105 mmol/L (ref 98–111)
Creatinine, Ser: 0.66 mg/dL (ref 0.44–1.00)
GFR, Estimated: >60 mL/min (ref >60)
Glucose, Bld: 94 mg/dL (ref 70–99)
Potassium: 4.4 mmol/L (ref 3.5–5.1)
Sodium: 138 mmol/L (ref 135–145)

## 2020-09-12 LAB — PROTIME-INR
INR: 0.9 (ref 0.8–1.2)
Prothrombin Time: 11.4 seconds (ref 11.4–15.2)

## 2020-09-12 MED ORDER — MIDAZOLAM HCL 2 MG/2ML IJ SOLN
INTRAMUSCULAR | Status: AC
  Filled 2020-09-12: qty 2

## 2020-09-12 MED ORDER — IODIXANOL 320 MG/ML IV SOLN
100.0000 mL | Freq: Once | INTRAVENOUS | Status: AC | PRN
  Administered 2020-09-12: 10 mL via INTRA_ARTERIAL

## 2020-09-12 MED ORDER — LORAZEPAM 0.5 MG PO TABS: 2.0000 mg | ORAL_TABLET | Freq: Once | ORAL | Status: AC

## 2020-09-12 MED ORDER — FENTANYL CITRATE (PF) 100 MCG/2ML IJ SOLN
INTRAMUSCULAR | Status: AC
  Filled 2020-09-12: qty 2

## 2020-09-12 MED ORDER — MIDAZOLAM HCL 2 MG/2ML IJ SOLN
INTRAMUSCULAR | Status: AC | PRN
  Administered 2020-09-12: 1 mg via INTRAVENOUS

## 2020-09-12 MED ORDER — FENTANYL CITRATE (PF) 100 MCG/2ML IJ SOLN
INTRAMUSCULAR | Status: AC | PRN
  Administered 2020-09-12: 50 ug via INTRAVENOUS

## 2020-09-12 MED ORDER — IODIXANOL 320 MG/ML IV SOLN
100.0000 mL | Freq: Once | INTRAVENOUS | Status: AC | PRN
  Administered 2020-09-12: 90 mL via INTRA_ARTERIAL

## 2020-09-12 MED ORDER — LORAZEPAM 0.5 MG PO TABS
ORAL_TABLET | ORAL | Status: AC
  Administered 2020-09-12: 2 mg via ORAL
  Filled 2020-09-12: qty 4

## 2020-09-12 MED ORDER — SODIUM CHLORIDE 0.9 % IV SOLN: INTRAVENOUS | Status: DC

## 2020-09-12 MED ORDER — LIDOCAINE HCL (PF) 1 % IJ SOLN
INTRAMUSCULAR | Status: AC
  Filled 2020-09-12: qty 30

## 2020-09-12 NOTE — Progress Notes (Signed)
Ativan was ordered unable to pull from pyxis at this time [facility] called and informed med not given. States they will give it down there.

## 2020-09-12 NOTE — Sedation Documentation (Signed)
36fr exoseal to right fem artery by Dr Deanne Coffer

## 2020-09-12 NOTE — Discharge Instructions (Signed)
Angiogram, Care After This sheet gives you information about how to care for yourself after your procedure. Your health care provider may also give you more specific instructions. If you have problems or questions, contact your health care provider. What can I expect after the procedure? After the procedure, it is common to have bruising and tenderness at the catheter insertion area. Follow these instructions at home: Insertion site care  Follow instructions from your health care provider about how to take care of your insertion site. Make sure you: ? Wash your hands with soap and water before you change your bandage (dressing). If soap and water are not available, use hand sanitizer. ? Change your dressing as told by your health care provider. ? Leave stitches (sutures), skin glue, or adhesive strips in place. These skin closures may need to stay in place for 2 weeks or longer. If adhesive strip edges start to loosen and curl up, you may trim the loose edges. Do not remove adhesive strips completely unless your health care provider tells you to do that.  Do not take baths, swim, or use a hot tub until your health care provider approves.  You may shower 24-48 hours after the procedure or as told by your health care provider. ? Gently wash the site with plain soap and water. ? Pat the area dry with a clean towel. ? Do not rub the site. This may cause bleeding.  Do not apply powder or lotion to the site. Keep the site clean and dry.  Check your insertion site every day for signs of infection. Check for: ? Redness, swelling, or pain. ? Fluid or blood. ? Warmth. ? Pus or a bad smell. Activity  Rest as told by your health care provider, usually for 1-2 days.  Do not lift anything that is heavier than 10 lbs. (4.5 kg) or as told by your health care provider.  Do not drive for 24 hours if you were given a medicine to help you relax (sedative).  Do not drive or use heavy machinery while  taking prescription pain medicine. General instructions   Return to your normal activities as told by your health care provider, usually in about a week. Ask your health care provider what activities are safe for you.  If the catheter site starts bleeding, lie flat and put pressure on the site. If the bleeding does not stop, get help right away. This is a medical emergency.  Drink enough fluid to keep your urine clear or pale yellow. This helps flush the contrast dye from your body.  Take over-the-counter and prescription medicines only as told by your health care provider.  Keep all follow-up visits as told by your health care provider. This is important. Contact a health care provider if:  You have a fever or chills.  You have redness, swelling, or pain around your insertion site.  You have fluid or blood coming from your insertion site.  The insertion site feels warm to the touch.  You have pus or a bad smell coming from your insertion site.  You have bruising around the insertion site.  You notice blood collecting in the tissue around the catheter site (hematoma). The hematoma may be painful to the touch. Get help right away if:  You have severe pain at the catheter insertion area.  The catheter insertion area swells very fast.  The catheter insertion area is bleeding, and the bleeding does not stop when you hold steady pressure on the area.    The area near or just beyond the catheter insertion site becomes pale, cool, tingly, or numb. These symptoms may represent a serious problem that is an emergency. Do not wait to see if the symptoms will go away. Get medical help right away. Call your local emergency services (911 in the [place] Do not drive yourself to the hospital. Summary  After the procedure, it is common to have bruising and tenderness at the catheter insertion area.  After the procedure, it is important to rest and drink plenty of fluids.  Do not take baths,  swim, or use a hot tub until your health care provider says it is okay to do so. You may shower 24-48 hours after the procedure or as told by your health care provider.  If the catheter site starts bleeding, lie flat and put pressure on the site. If the bleeding does not stop, get help right away. This is a medical emergency. This information is not intended to replace advice given to you by your health care provider. Make sure you discuss any questions you have with your health care provider. Document Revised: 10/29/2017 Document Reviewed: 10/21/2016 Elsevier Patient Education  2020 [employer]  

## 2020-09-12 NOTE — Procedures (Signed)
  Procedure: RUE arteriogram   EBL:   minimal Complications:  none immediate  See full dictation in [employer].  Thora Lance MD Main # [phone] Pager  [phone] Mobile [phone]

## 2020-09-12 NOTE — H&P (Signed)
Chief Complaint: Numbness right ring and index finger  Referring Physician(s): Davis Gourd  Supervising Physician: Oley Balm  Patient Status: [facility] - Out-pt  History of Present Illness: Gabrielle [name] is a 44 y.o. female who has had numbness and a coldness in the right index and ring fingers since early September.  She has undergone doppler studies and she has seen an orthopedic surgeon.   Doppler performed at [facility] showed right distal ulnar artery occlusion, although I can palpate an ulnar pulse.   She is here today for right upper extremity angiography.  She is NPO. No blood thinners. She does smoke cigarettes which is definitely a risk factor and I explained the importance of smoking cessation.  She has not had an ECHO done to evaluate for thrombus.  Past Medical History:  Diagnosis Date  . Anxiety   . Depression   . Fibromyalgia   . Fibromyalgia   . Hypertension     Past Surgical History:  Procedure Laterality Date  . ABDOMINAL HYSTERECTOMY    . APPENDECTOMY    . CHOLECYSTECTOMY      Allergies: Morphine and related and Percocet [oxycodone-acetaminophen]  Medications: Prior to Admission medications   Medication Sig Start Date End Date Taking? Authorizing Provider  acetaminophen (TYLENOL) 500 MG tablet Take 1,000-1,500 mg by mouth every 6 (six) hours as needed for headache.   Yes [provider]  gabapentin (NEURONTIN) 400 MG capsule Take 2 capsules (800 mg total) by mouth 3 (three) times daily. 07/13/19  Yes Remus Loffler, PA-C  ibuprofen (ADVIL) 800 MG tablet TAKE 1 TABLET BY MOUTH EVERY 8 HOURS AS NEEDED 11/27/19  Yes Daphine Deutscher, Mary-Margaret, FNP  pantoprazole (PROTONIX) 40 MG tablet Take 1 tablet (40 mg total) by mouth daily. 01/02/19  Yes Remus Loffler, PA-C  albuterol (VENTOLIN HFA) 108 (90 Base) MCG/ACT inhaler Inhale 2 puffs into the lungs every 6 (six) hours as needed for wheezing or shortness of breath. 09/04/19   Junie Spencer, FNP    ALPRAZolam Prudy Feeler) 0.5 MG tablet Take 1 tablet (0.5 mg total) by mouth 2 (two) times daily as needed. for anxiety 07/13/19   Remus Loffler, PA-C  ARIPiprazole (ABILIFY) 5 MG tablet Take 1 tablet (5 mg total) by mouth daily. 01/02/19   Remus Loffler, PA-C  buPROPion (WELLBUTRIN SR) 150 MG 12 hr tablet Take 1 tablet (150 mg total) by mouth 2 (two) times daily. 01/02/19   Remus Loffler, PA-C  ciprofloxacin (CIPRO) 500 MG tablet Take 1 tablet (500 mg total) by mouth 2 (two) times daily. 07/25/19   Remus Loffler, PA-C  clindamycin (CLINDAGEL) 1 % gel Apply topically 2 (two) times daily. 09/23/19   Remus Loffler, PA-C  diphenoxylate-atropine (LOMOTIL) 2.5-0.025 MG tablet Take 1 tablet by mouth 4 (four) times daily as needed for diarrhea or loose stools. 10/24/18   Remus Loffler, PA-C  fluconazole (DIFLUCAN) 150 MG tablet 1 po q week x 4 weeks 09/23/19   Remus Loffler, PA-C  HYDROcodone-acetaminophen (NORCO) 7.5-325 MG tablet Take 1 tablet by mouth every 6 (six) hours as needed for moderate pain. 01/05/20   Junie Spencer, FNP  loratadine (CLARITIN) 10 MG tablet Take 1 tablet (10 mg total) by mouth daily. 01/02/19   Remus Loffler, PA-C  predniSONE (STERAPRED UNI-PAK 48 TAB) 10 MG (48) TBPK tablet Take as directed for 12 days 07/13/19   Remus Loffler, PA-C  tamsulosin (FLOMAX) 0.4 MG CAPS capsule Take  1 capsule (0.4 mg total) by mouth daily. 12/28/19   Junie Spencer, FNP  tiZANidine (ZANAFLEX) 4 MG tablet Take 1 tablet (4 mg total) by mouth every 8 (eight) hours as needed for muscle spasms. Patient not taking: Reported on 07/31/2019 07/13/19   Remus Loffler, PA-C     Family History  Problem Relation Age of Onset  . Fibromyalgia Mother   . Stroke Mother   . Osteoporosis Mother   . Cancer Father   . Heart disease Father     Social History   Socioeconomic History  . Marital status: Married    Spouse name: Not on file  . Number of children: Not on file  . Years of education: Not on file  .  Highest education level: Not on file  Occupational History  . Not on file  Tobacco Use  . Smoking status: Current Every Day Smoker    Packs/day: 2.00    Years: 28.00    Pack years: 56.00  . Smokeless tobacco: Never Used  Vaping Use  . Vaping Use: Never used  Substance and Sexual Activity  . Alcohol use: No  . Drug use: Yes    Types: Marijuana  . Sexual activity: Not on file  Other Topics Concern  . Not on file  Social History Narrative  . Not on file   Social Determinants of Health   Financial Resource Strain:   . Difficulty of Paying Living Expenses: Not on file  Food Insecurity:   . Worried About Programme researcher, broadcasting/film/video in the Last Year: Not on file  . Ran Out of Food in the Last Year: Not on file  Transportation Needs:   . Lack of Transportation (Medical): Not on file  . Lack of Transportation (Non-Medical): Not on file  Physical Activity:   . Days of Exercise per Week: Not on file  . Minutes of Exercise per Session: Not on file  Stress:   . Feeling of Stress : Not on file  Social Connections:   . Frequency of Communication with Friends and Family: Not on file  . Frequency of Social Gatherings with Friends and Family: Not on file  . Attends Religious Services: Not on file  . Active Member of Clubs or Organizations: Not on file  . Attends Banker Meetings: Not on file  . Marital Status: Not on file     Review of Systems: A 12 point ROS discussed and pertinent positives are indicated in the HPI above.  All other systems are negative.  Review of Systems  Vital Signs: BP 109/77   Pulse (!) 58   Temp (!) 97.5 F (36.4 C) (Oral)   Resp 18   Ht 5\' 7"  (1.702 m)   Wt 73.9 kg   SpO2 98%   BMI 25.53 kg/m   Physical Exam Vitals reviewed.  Constitutional:      Appearance: Normal appearance.  HENT:     Head: Normocephalic and atraumatic.  Eyes:     Extraocular Movements: Extraocular movements intact.  Cardiovascular:     Rate and Rhythm: Normal  rate and regular rhythm.     Comments: Palpable right radial pulse = 2+ Palpable right ulnar pulse = 1+ Palpable bilateral femoral pulses = 2+ Pulmonary:     Effort: Pulmonary effort is normal. No respiratory distress.     Breath sounds: Normal breath sounds.  Abdominal:     General: There is no distension.     Palpations: Abdomen is soft.  Tenderness: There is no abdominal tenderness.  Musculoskeletal:        General: Normal range of motion.  Skin:    General: Skin is warm and dry.  Neurological:     General: No focal deficit present.     Mental Status: She is alert and oriented to person, place, and time.  Psychiatric:        Behavior: Behavior normal.        Thought Content: Thought content normal.        Judgment: Judgment normal.     Comments: She is very anxious about the procedure.     Imaging: No results found.  Labs:  CBC: No results for input(s): WBC, HGB, HCT, PLT in the last 8760 hours.  COAGS: No results for input(s): INR, APTT in the last 8760 hours.  BMP: No results for input(s): NA, K, CL, CO2, GLUCOSE, BUN, CALCIUM, CREATININE, GFRNONAA, GFRAA in the last 8760 hours.  Invalid input(s): CMP  LIVER FUNCTION TESTS: No results for input(s): BILITOT, AST, ALT, ALKPHOS, PROT, ALBUMIN in the last 8760 hours.  TUMOR MARKERS: No results for input(s): AFPTM, CEA, CA199, CHROMGRNA in the last 8760 hours.  Assessment and Plan:  Numbness in the right ring and index fingers.  Will proceed with right upper extremity angiography and possible angioplasty today by Dr. Deanne Coffer.  Risks and benefits of right upper extremity angiography were discussed with the patient including, but not limited to bleeding, infection, vascular injury or contrast induced renal failure.  This interventional procedure involves the use of X-rays and because of the nature of the planned procedure, it is possible that we will have prolonged use of X-ray fluoroscopy.  Potential  radiation risks to you include (but are not limited to) the following: - A slightly elevated risk for cancer  several years later in life. This risk is typically less than 0.5% percent. This risk is low in comparison to the normal incidence of human cancer, which is 33% for women and 50% for men according to the [employer] Society. - Radiation induced injury can include skin redness, resembling a rash, tissue breakdown / ulcers and hair loss (which can be temporary or permanent).   The likelihood of either of these occurring depends on the difficulty of the procedure and whether you are sensitive to radiation due to previous procedures, disease, or genetic conditions.   IF your procedure requires a prolonged use of radiation, you will be notified and given written instructions for further action.  It is your responsibility to monitor the irradiated area for the 2 weeks following the procedure and to notify your physician if you are concerned that you have suffered a radiation induced injury.    All of the patient's questions were answered, patient is agreeable to proceed.  Consent signed and in chart.  Thank you for this interesting consult.  I greatly enjoyed meeting DEMI TRIEU and look forward to participating in their care.  A copy of this report was sent to the requesting provider on this date.  Electronically Signed: Gwynneth Macleod, PA-C   09/12/2020, 9:07 AM      I spent a total of  30 Minutes in face to face in clinical consultation, greater than 50% of which was counseling/coordinating care for RUE angiography.
